# Patient Record
Sex: Male | Born: 1965
Health system: Southern US, Community
[De-identification: ages and names within clinical notes are randomized; demographics above are authoritative.]

## PROBLEM LIST (undated history)

## (undated) DIAGNOSIS — I10 Essential (primary) hypertension: Secondary | ICD-10-CM

## (undated) HISTORY — DX: Essential (primary) hypertension: I10

## (undated) HISTORY — PX: WISDOM TOOTH EXTRACTION: SHX21

---

## 2000-02-04 ENCOUNTER — Emergency Department (HOSPITAL_COMMUNITY): Admission: EM | Admit: 2000-02-04 | Discharge: 2000-02-04 | Payer: Self-pay | Admitting: Emergency Medicine

## 2000-07-31 ENCOUNTER — Encounter: Payer: Self-pay | Admitting: Emergency Medicine

## 2000-07-31 ENCOUNTER — Emergency Department (HOSPITAL_COMMUNITY): Admission: EM | Admit: 2000-07-31 | Discharge: 2000-07-31 | Payer: Self-pay | Admitting: Emergency Medicine

## 2003-05-29 ENCOUNTER — Emergency Department (HOSPITAL_COMMUNITY): Admission: AD | Admit: 2003-05-29 | Discharge: 2003-05-29 | Payer: Self-pay | Admitting: Family Medicine

## 2005-06-18 IMAGING — CR DG KNEE COMPLETE 4+V*L*
4 series · 4 of 4 positions shown · non-contrast
Comparison: none

CLINICAL DATA: Fell, twisting knee.  Pain entire knee.
 FOUR VIEW LEFT KNEE
 An effusion is present.   No fracture is seen.  There is no dislocation.
 IMPRESSION 
 Positive for effusion.

[view not recorded (1 of 4)]
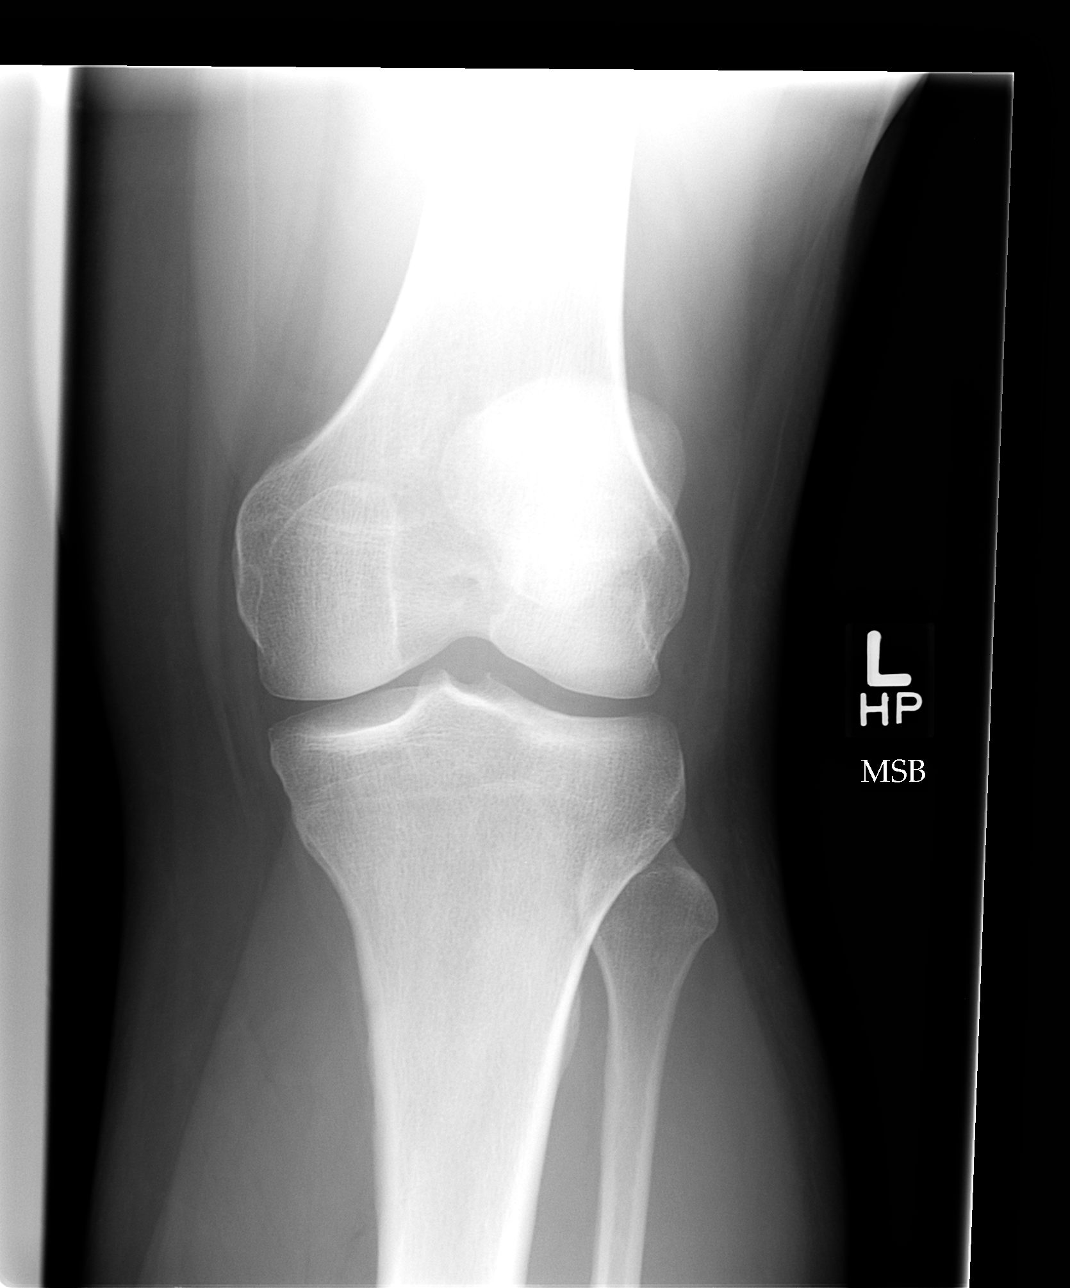

[view not recorded (2 of 4)]
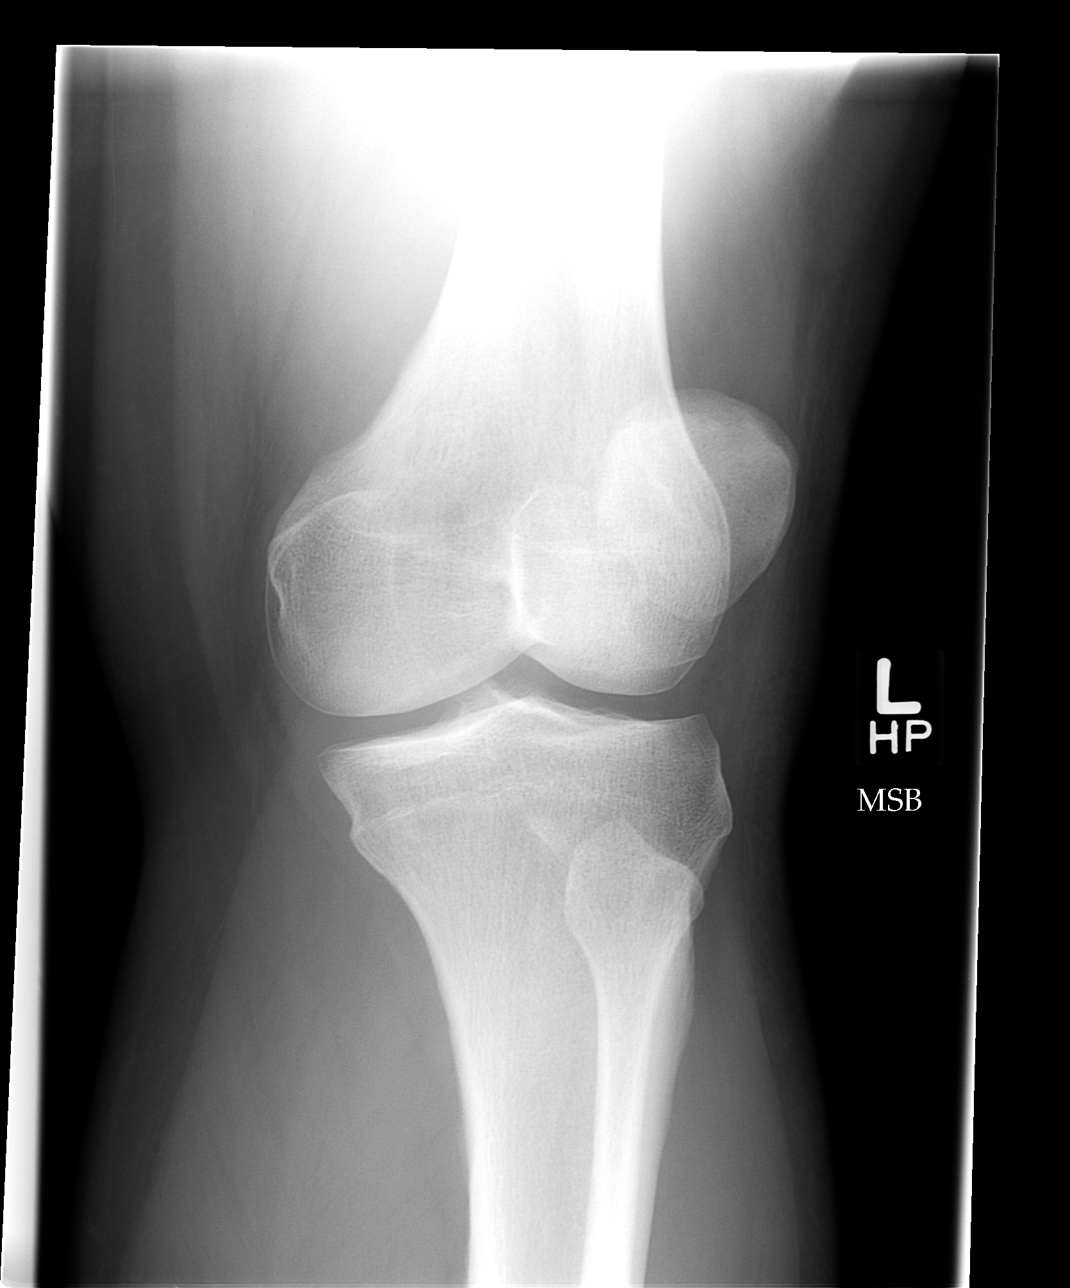

[view not recorded (3 of 4)]
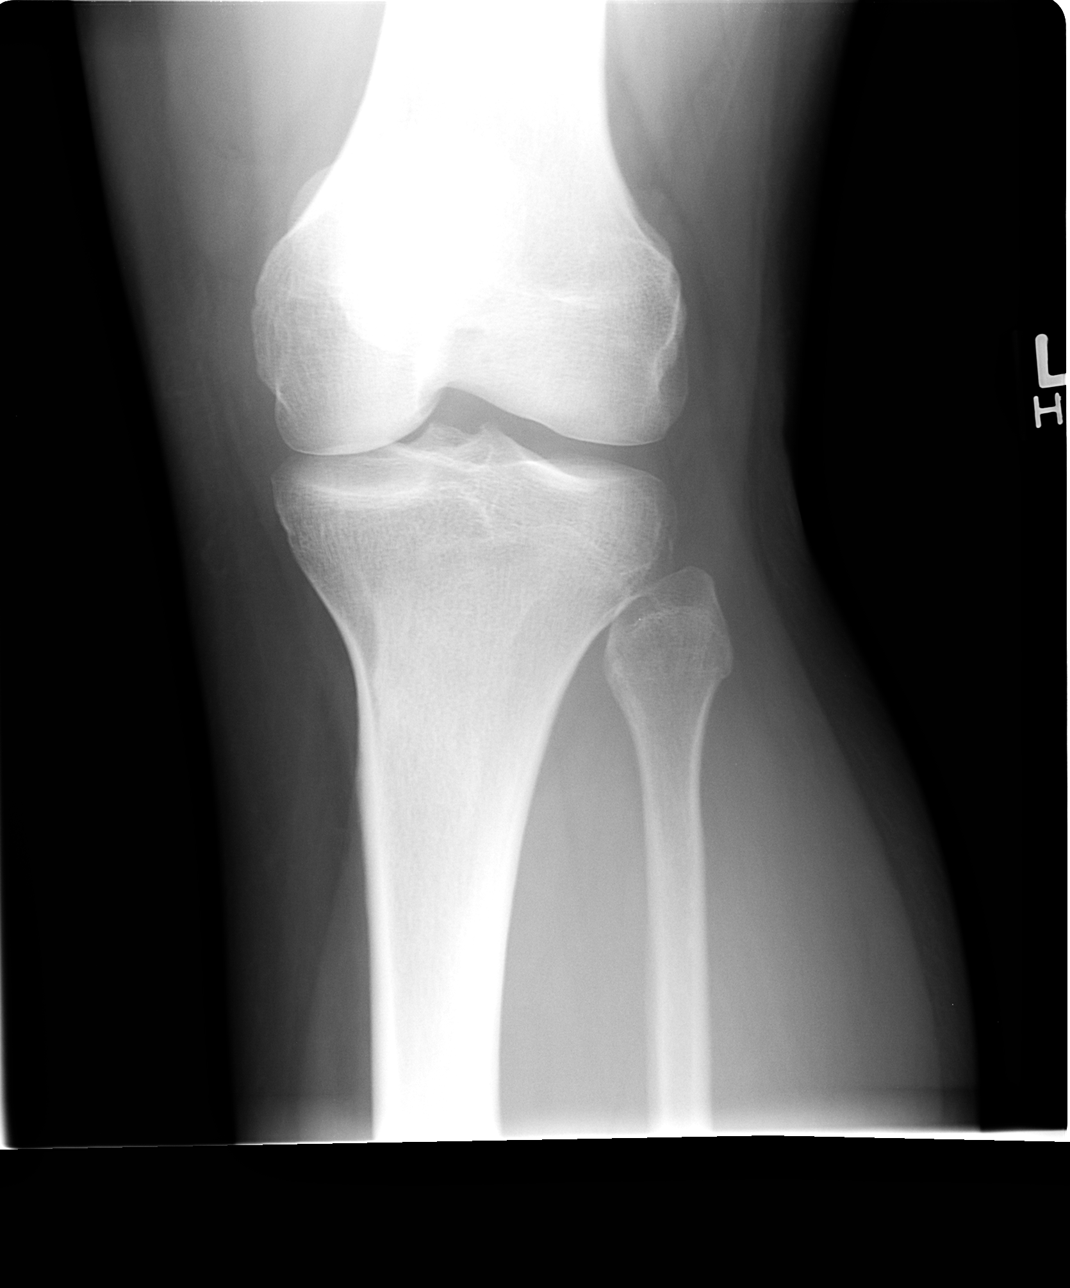

[view not recorded (4 of 4)]
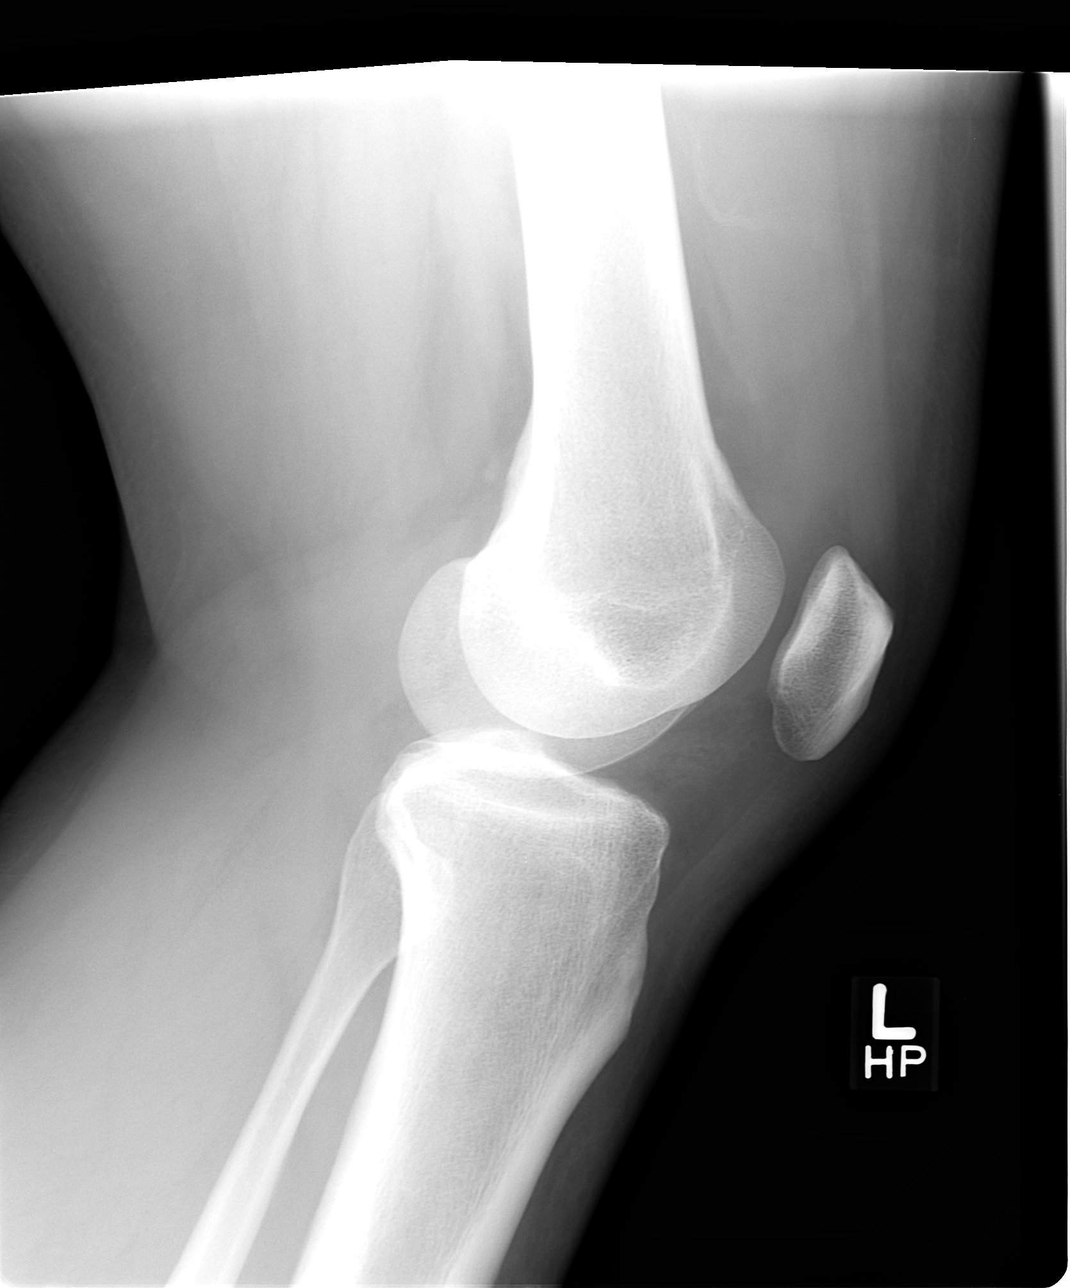

[4 of 4 positions shown; findings below may reference images not displayed]

## 2010-11-08 ENCOUNTER — Encounter: Payer: Self-pay | Admitting: Family Medicine

## 2010-11-08 DIAGNOSIS — M109 Gout, unspecified: Secondary | ICD-10-CM | POA: Insufficient documentation

## 2012-07-18 ENCOUNTER — Other Ambulatory Visit (INDEPENDENT_AMBULATORY_CARE_PROVIDER_SITE_OTHER): Payer: BC Managed Care – PPO

## 2012-07-18 DIAGNOSIS — M109 Gout, unspecified: Secondary | ICD-10-CM

## 2012-07-18 LAB — URIC ACID: Uric Acid, Serum: 8.6 mg/dL — ABNORMAL HIGH (ref 4.0–7.8)

## 2013-08-18 ENCOUNTER — Other Ambulatory Visit: Payer: BC Managed Care – PPO

## 2013-08-18 ENCOUNTER — Other Ambulatory Visit: Payer: Self-pay | Admitting: Family Medicine

## 2013-08-18 DIAGNOSIS — Z0000 Encounter for general adult medical examination without abnormal findings: Secondary | ICD-10-CM

## 2013-08-18 LAB — COMPREHENSIVE METABOLIC PANEL
ALT: 19 U/L (ref 0–53)
AST: 16 U/L (ref 0–37)
Albumin: 4.5 g/dL (ref 3.5–5.2)
Alkaline Phosphatase: 41 U/L (ref 39–117)
BUN: 16 mg/dL (ref 6–23)
CO2: 25 mEq/L (ref 19–32)
Calcium: 9.6 mg/dL (ref 8.4–10.5)
Chloride: 102 mEq/L (ref 96–112)
Creat: 0.91 mg/dL (ref 0.50–1.35)
Glucose, Bld: 100 mg/dL — ABNORMAL HIGH (ref 70–99)
Potassium: 4.5 mEq/L (ref 3.5–5.3)
Sodium: 137 mEq/L (ref 135–145)
Total Bilirubin: 0.9 mg/dL (ref 0.2–1.2)
Total Protein: 7.2 g/dL (ref 6.0–8.3)

## 2013-08-18 LAB — CBC WITH DIFFERENTIAL/PLATELET
Basophils Absolute: 0 10*3/uL (ref 0.0–0.1)
Basophils Relative: 0 % (ref 0–1)
Eosinophils Absolute: 0.1 10*3/uL (ref 0.0–0.7)
Eosinophils Relative: 1 % (ref 0–5)
HCT: 45.5 % (ref 39.0–52.0)
Hemoglobin: 15.8 g/dL (ref 13.0–17.0)
Lymphocytes Relative: 43 % (ref 12–46)
Lymphs Abs: 2.3 10*3/uL (ref 0.7–4.0)
MCH: 30.7 pg (ref 26.0–34.0)
MCHC: 34.7 g/dL (ref 30.0–36.0)
MCV: 88.5 fL (ref 78.0–100.0)
Monocytes Absolute: 0.4 10*3/uL (ref 0.1–1.0)
Monocytes Relative: 8 % (ref 3–12)
Neutro Abs: 2.6 10*3/uL (ref 1.7–7.7)
Neutrophils Relative %: 48 % (ref 43–77)
Platelets: 169 10*3/uL (ref 150–400)
RBC: 5.14 MIL/uL (ref 4.22–5.81)
RDW: 13.2 % (ref 11.5–15.5)
WBC: 5.4 10*3/uL (ref 4.0–10.5)

## 2013-08-18 LAB — LIPID PANEL
Cholesterol: 145 mg/dL (ref 0–200)
HDL: 57 mg/dL (ref >39)
LDL Cholesterol: 72 mg/dL (ref 0–99)
Total CHOL/HDL Ratio: 2.5 Ratio
Triglycerides: 80 mg/dL (ref <150)
VLDL: 16 mg/dL (ref 0–40)

## 2013-08-24 ENCOUNTER — Encounter: Payer: Self-pay | Admitting: Family Medicine

## 2013-08-24 ENCOUNTER — Ambulatory Visit (INDEPENDENT_AMBULATORY_CARE_PROVIDER_SITE_OTHER): Payer: BC Managed Care – PPO | Admitting: Family Medicine

## 2013-08-24 VITALS — BP 122/86 | HR 80 | Temp 98.8°F | Resp 16 | Ht 67.5 in | Wt 228.0 lb

## 2013-08-24 DIAGNOSIS — M109 Gout, unspecified: Secondary | ICD-10-CM

## 2013-08-24 DIAGNOSIS — Z0000 Encounter for general adult medical examination without abnormal findings: Secondary | ICD-10-CM

## 2013-08-24 MED ORDER — ALLOPURINOL 100 MG PO TABS: 300.0000 mg | ORAL_TABLET | Freq: Every day | ORAL | Status: DC

## 2013-08-24 NOTE — Addendum Note (Signed)
Addended by: Reginia FortsATKINS, Jerek Meulemans on: 08/24/2013 09:27 AM   Modules accepted: Orders

## 2013-08-24 NOTE — Progress Notes (Signed)
Subjective:    Patient ID: Paul Lynn, male    DOB: 1966/02/04, 48 y.o.   MRN: [id number]  HPI Patient is here today for complete physical exam. He has no concerns. He still has a possibly 3 or 4 gout flares per year. He took allopurinol 100 mg by mouth daily. His check acid level remained elevated at 8.6. The patient stopped the medication thereafter and was lost to followup. Otherwise he is doing well. He does have a significant family history of prostate cancer in the 71s. Therefore he is due for premature screen for prostate cancer. Lab on 08/18/2013  Component Date Value Ref Range Status  . Cholesterol 08/18/2013 145  0 - 200 mg/dL Final   Comment: ATP III Classification:                                < 200        mg/dL        Desirable                               200 - 239     mg/dL        Borderline High                               >= 240        mg/dL        High                             . Triglycerides 08/18/2013 80  <150 mg/dL Final  . HDL 08/18/2013 57  >39 mg/dL Final  . Total CHOL/HDL Ratio 08/18/2013 2.5   Final  . VLDL 08/18/2013 16  0 - 40 mg/dL Final  . LDL Cholesterol 08/18/2013 72  0 - 99 mg/dL Final   Comment:                            Total Cholesterol/HDL Ratio:CHD Risk                                                 Coronary Heart Disease Risk Table                                                                 Men       Women                                   1/2 Average Risk              3.4        3.3  Average Risk              5.0        4.4                                    2X Average Risk              9.6        7.1                                    3X Average Risk             23.4       11.0                          Use the calculated Patient Ratio above and the CHD Risk table                           to determine the patient's CHD Risk.                          ATP III Classification (LDL):                        < 100        mg/dL         Optimal                               100 - 129     mg/dL         Near or Above Optimal                               130 - 159     mg/dL         Borderline High                               160 - 189     mg/dL         High                                > 190        mg/dL         Very High                             . WBC 08/18/2013 5.4  4.0 - 10.5 K/uL Final  . RBC 08/18/2013 5.14  4.22 - 5.81 MIL/uL Final  . Hemoglobin 08/18/2013 15.8  13.0 - 17.0 g/dL Final  . HCT 08/18/2013 45.5  39.0 - 52.0 % Final  . MCV 08/18/2013 88.5  78.0 - 100.0 fL Final  . MCH 08/18/2013 30.7  26.0 - 34.0 pg Final  . MCHC 08/18/2013 34.7  30.0 - 36.0 g/dL Final  . RDW 08/18/2013 13.2  11.5 - 15.5 % Final  . Platelets 08/18/2013 169  150 -  400 K/uL Final  . Neutrophils Relative % 08/18/2013 48  43 - 77 % Final  . Neutro Abs 08/18/2013 2.6  1.7 - 7.7 K/uL Final  . Lymphocytes Relative 08/18/2013 43  12 - 46 % Final  . Lymphs Abs 08/18/2013 2.3  0.7 - 4.0 K/uL Final  . Monocytes Relative 08/18/2013 8  3 - 12 % Final  . Monocytes Absolute 08/18/2013 0.4  0.1 - 1.0 K/uL Final  . Eosinophils Relative 08/18/2013 1  0 - 5 % Final  . Eosinophils Absolute 08/18/2013 0.1  0.0 - 0.7 K/uL Final  . Basophils Relative 08/18/2013 0  0 - 1 % Final  . Basophils Absolute 08/18/2013 0.0  0.0 - 0.1 K/uL Final  . Smear Review 08/18/2013 Criteria for review not met   Final  . Sodium 08/18/2013 137  135 - 145 mEq/L Final  . Potassium 08/18/2013 4.5  3.5 - 5.3 mEq/L Final  . Chloride 08/18/2013 102  96 - 112 mEq/L Final  . CO2 08/18/2013 25  19 - 32 mEq/L Final  . Glucose, Bld 08/18/2013 100* 70 - 99 mg/dL Final  . BUN 25/48/3234 16  6 - 23 mg/dL Final  . Creat 68/87/3730 0.91  0.50 - 1.35 mg/dL Final  . Total Bilirubin 08/18/2013 0.9  0.2 - 1.2 mg/dL Final  . Alkaline Phosphatase 08/18/2013 41  39 - 117 U/L Final  . AST 08/18/2013 16  0 - 37 U/L Final  . ALT 08/18/2013 19  0  - 53 U/L Final  . Total Protein 08/18/2013 7.2  6.0 - 8.3 g/dL Final  . Albumin 81/68/3870 4.5  3.5 - 5.2 g/dL Final  . Calcium 65/82/6088 9.6  8.4 - 10.5 mg/dL Final   Past Medical History  Diagnosis Date  . Gout    Current Outpatient Prescriptions on File Prior to Visit  Medication Sig Dispense Refill  . indomethacin (INDOCIN) 50 MG capsule Take 50 mg by mouth 2 (two) times daily with a meal.         No current facility-administered medications on file prior to visit.   No Known Allergies History reviewed. No pertinent past surgical history. History   Social History  . Marital Status: Married    Spouse Name: N/A    Number of Children: N/A  . Years of Education: N/A   Occupational History  . Not on file.   Social History Main Topics  . Smoking status: Never Smoker   . Smokeless tobacco: Never Used  . Alcohol Use: Yes     Comment: occasional  . Drug Use: No  . Sexual Activity: Yes   Other Topics Concern  . Not on file   Social History Narrative  . No narrative on file   Family History  Problem Relation Age of Onset  . Cancer Father     prostate  . Cancer Paternal Uncle     prostate      Review of Systems  All other systems reviewed and are negative.      Objective:   Physical Exam  Vitals reviewed. Constitutional: He is oriented to person, place, and time. He appears well-developed and well-nourished. No distress.  HENT:  Head: Normocephalic and atraumatic.  Right Ear: External ear normal.  Left Ear: External ear normal.  Nose: Nose normal.  Mouth/Throat: Oropharynx is clear and moist. No oropharyngeal exudate.  Eyes: Conjunctivae and EOM are normal. Pupils are equal, round, and reactive to light. Right eye exhibits no discharge. Left eye exhibits no  discharge. No scleral icterus.  Neck: Normal range of motion. Neck supple. No JVD present. No tracheal deviation present. No thyromegaly present.  Cardiovascular: Normal rate, regular rhythm, normal  heart sounds and intact distal pulses.  Exam reveals no gallop and no friction rub.   No murmur heard. Pulmonary/Chest: Effort normal and breath sounds normal. No stridor. No respiratory distress. He has no wheezes. He has no rales. He exhibits no tenderness.  Abdominal: Soft. Bowel sounds are normal. He exhibits no distension and no mass. There is no tenderness. There is no rebound and no guarding.  Genitourinary: Rectum normal, prostate normal and penis normal.  Musculoskeletal: Normal range of motion. He exhibits no edema and no tenderness.  Lymphadenopathy:    He has no cervical adenopathy.  Neurological: He is alert and oriented to person, place, and time. He has normal reflexes. He displays normal reflexes. No cranial nerve deficit. He exhibits normal muscle tone. Coordination normal.  Skin: Skin is warm. No rash noted. He is not diaphoretic. No erythema. No pallor.  Psychiatric: He has a normal mood and affect. His behavior is normal. Judgment and thought content normal.          Assessment & Plan:  1. Routine general medical examination at a health care facility Patient's physical exam is completely normal. Regular anticipatory guidance was provided. I recommended 10-15 pounds weight loss. Otherwise his labs are excellent. His blood pressure is excellent his preventative care is up to date. I will add a PSA - PSA  2. Gout Begin allopurinol 300 mg by mouth daily. Start indomethacin 25 mg by mouth daily. In 6 weeks return for a uric acid level. His uric acid level is less than 6, the patient can then discontinue indomethacin. - allopurinol (ZYLOPRIM) 100 MG tablet; Take 3 tablets (300 mg total) by mouth daily.  Dispense: 90 tablet; Refill: 5

## 2013-08-25 ENCOUNTER — Encounter: Payer: Self-pay | Admitting: Family Medicine

## 2013-08-25 LAB — PSA: PSA: 1.25 ng/mL (ref <=4.00)

## 2013-10-12 ENCOUNTER — Other Ambulatory Visit: Payer: BC Managed Care – PPO

## 2013-10-12 DIAGNOSIS — E790 Hyperuricemia without signs of inflammatory arthritis and tophaceous disease: Secondary | ICD-10-CM

## 2013-10-12 LAB — URIC ACID: URIC ACID, SERUM: 6.5 mg/dL (ref 4.0–7.8)

## 2013-10-26 ENCOUNTER — Encounter: Payer: Self-pay | Admitting: *Deleted

## 2014-05-14 ENCOUNTER — Other Ambulatory Visit: Payer: Self-pay | Admitting: Family Medicine

## 2014-05-14 MED ORDER — INDOMETHACIN 50 MG PO CAPS
50.0000 mg | ORAL_CAPSULE | Freq: Two times a day (BID) | ORAL | Status: DC
Start: 2014-05-14 — End: 2015-11-14

## 2014-05-14 MED ORDER — ALLOPURINOL 100 MG PO TABS: 300.0000 mg | ORAL_TABLET | Freq: Every day | ORAL | Status: DC

## 2014-05-14 NOTE — Telephone Encounter (Signed)
Medication refilled per protocol. 

## 2014-05-14 NOTE — Telephone Encounter (Signed)
Patient calling to see if he can get refill on his gout medication  [phone]

## 2014-05-17 ENCOUNTER — Other Ambulatory Visit: Payer: Self-pay | Admitting: *Deleted

## 2014-05-17 MED ORDER — INDOMETHACIN 50 MG PO CAPS: 50.0000 mg | ORAL_CAPSULE | Freq: Two times a day (BID) | ORAL | Status: DC

## 2014-05-17 MED ORDER — COLCHICINE 0.6 MG PO TABS: ORAL_TABLET | ORAL | Status: DC

## 2014-05-17 NOTE — Telephone Encounter (Signed)
Prescription sent to pharmacy.

## 2014-05-17 NOTE — Telephone Encounter (Signed)
Ok with colcrys for gout.  1.2 mg po x 1 and 0.6 mg po in 2 hrs if persistent.

## 2014-05-17 NOTE — Telephone Encounter (Signed)
Received fax requesting refill on Colcrys 0.6mg .   Medication not noted in chart.   MD please advise.

## 2014-09-01 ENCOUNTER — Other Ambulatory Visit: Payer: BLUE CROSS/BLUE SHIELD

## 2014-09-01 ENCOUNTER — Other Ambulatory Visit: Payer: Self-pay | Admitting: Family Medicine

## 2014-09-01 DIAGNOSIS — Z79899 Other long term (current) drug therapy: Secondary | ICD-10-CM

## 2014-09-01 DIAGNOSIS — Z0000 Encounter for general adult medical examination without abnormal findings: Secondary | ICD-10-CM

## 2014-09-01 DIAGNOSIS — Z8739 Personal history of other diseases of the musculoskeletal system and connective tissue: Secondary | ICD-10-CM

## 2014-09-01 LAB — CBC WITH DIFFERENTIAL/PLATELET
Basophils Absolute: 0 10*3/uL (ref 0.0–0.1)
Basophils Relative: 0 % (ref 0–1)
EOS ABS: 0.1 10*3/uL (ref 0.0–0.7)
EOS PCT: 2 % (ref 0–5)
HEMATOCRIT: 47 % (ref 39.0–52.0)
Hemoglobin: 16 g/dL (ref 13.0–17.0)
LYMPHS ABS: 2.1 10*3/uL (ref 0.7–4.0)
LYMPHS PCT: 40 % (ref 12–46)
MCH: 30.7 pg (ref 26.0–34.0)
MCHC: 34 g/dL (ref 30.0–36.0)
MCV: 90.2 fL (ref 78.0–100.0)
MONO ABS: 0.5 10*3/uL (ref 0.1–1.0)
MONOS PCT: 9 % (ref 3–12)
MPV: 10.4 fL (ref 8.6–12.4)
Neutro Abs: 2.6 10*3/uL (ref 1.7–7.7)
Neutrophils Relative %: 49 % (ref 43–77)
Platelets: 160 10*3/uL (ref 150–400)
RBC: 5.21 MIL/uL (ref 4.22–5.81)
RDW: 13 % (ref 11.5–15.5)
WBC: 5.3 10*3/uL (ref 4.0–10.5)

## 2014-09-01 LAB — COMPLETE METABOLIC PANEL WITH GFR
ALK PHOS: 41 U/L (ref 39–117)
ALT: 19 U/L (ref 0–53)
AST: 19 U/L (ref 0–37)
Albumin: 4.3 g/dL (ref 3.5–5.2)
BUN: 16 mg/dL (ref 6–23)
CO2: 23 mEq/L (ref 19–32)
CREATININE: 0.97 mg/dL (ref 0.50–1.35)
Calcium: 9.1 mg/dL (ref 8.4–10.5)
Chloride: 104 mEq/L (ref 96–112)
GFR, Est African American: >89 mL/min
GLUCOSE: 96 mg/dL (ref 70–99)
Potassium: 4.1 mEq/L (ref 3.5–5.3)
Sodium: 138 mEq/L (ref 135–145)
TOTAL PROTEIN: 6.9 g/dL (ref 6.0–8.3)
Total Bilirubin: 1.2 mg/dL (ref 0.2–1.2)

## 2014-09-01 LAB — LIPID PANEL
Cholesterol: 123 mg/dL (ref 0–200)
HDL: 44 mg/dL (ref >=40)
LDL Cholesterol: 65 mg/dL (ref 0–99)
Total CHOL/HDL Ratio: 2.8 Ratio
Triglycerides: 68 mg/dL (ref <150)
VLDL: 14 mg/dL (ref 0–40)

## 2014-09-02 LAB — TSH: TSH: 1.794 u[IU]/mL (ref 0.350–4.500)

## 2014-09-03 ENCOUNTER — Encounter: Payer: Self-pay | Admitting: Family Medicine

## 2014-09-03 ENCOUNTER — Ambulatory Visit (INDEPENDENT_AMBULATORY_CARE_PROVIDER_SITE_OTHER): Payer: BLUE CROSS/BLUE SHIELD | Admitting: Family Medicine

## 2014-09-03 VITALS — BP 136/94 | HR 80 | Temp 98.5°F | Resp 16 | Ht 69.0 in | Wt 235.0 lb

## 2014-09-03 DIAGNOSIS — Z0000 Encounter for general adult medical examination without abnormal findings: Secondary | ICD-10-CM | POA: Diagnosis not present

## 2014-09-03 NOTE — Progress Notes (Signed)
Subjective:    Patient ID: Paul Lynn, male    DOB: 11-29-1965, 49 y.o.   MRN: [id number]  HPI  Patient is here today for complete physical exam. He has no medical concerns. He is doing very well. Blood pressures elevated at 136/94. Most recent lab work as listed below: Lab on 09/01/2014  Component Date Value Ref Range Status  . Sodium 09/01/2014 138  135 - 145 mEq/L Final  . Potassium 09/01/2014 4.1  3.5 - 5.3 mEq/L Final  . Chloride 09/01/2014 104  96 - 112 mEq/L Final  . CO2 09/01/2014 23  19 - 32 mEq/L Final  . Glucose, Bld 09/01/2014 96  70 - 99 mg/dL Final  . BUN 09/01/2014 16  6 - 23 mg/dL Final  . Creat 09/01/2014 0.97  0.50 - 1.35 mg/dL Final  . Total Bilirubin 09/01/2014 1.2  0.2 - 1.2 mg/dL Final  . Alkaline Phosphatase 09/01/2014 41  39 - 117 U/L Final  . AST 09/01/2014 19  0 - 37 U/L Final  . ALT 09/01/2014 19  0 - 53 U/L Final  . Total Protein 09/01/2014 6.9  6.0 - 8.3 g/dL Final  . Albumin 09/01/2014 4.3  3.5 - 5.2 g/dL Final  . Calcium 09/01/2014 9.1  8.4 - 10.5 mg/dL Final  . GFR, Est African American 09/01/2014 >89   Final  . GFR, Est Non African American 09/01/2014 >89   Final   Comment:   The estimated GFR is a calculation valid for adults (>=84 years old) that uses the CKD-EPI algorithm to adjust for age and sex. It is   not to be used for children, pregnant women, hospitalized patients,    patients on dialysis, or with rapidly changing kidney function. According to the NKDEP, eGFR >89 is normal, 60-89 shows mild impairment, 30-59 shows moderate impairment, 15-29 shows severe impairment and <15 is ESRD.     . TSH 09/01/2014 1.794  0.350 - 4.500 uIU/mL Final  . WBC 09/01/2014 5.3  4.0 - 10.5 K/uL Final  . RBC 09/01/2014 5.21  4.22 - 5.81 MIL/uL Final  . Hemoglobin 09/01/2014 16.0  13.0 - 17.0 g/dL Final  . HCT 09/01/2014 47.0  39.0 - 52.0 % Final  . MCV 09/01/2014 90.2  78.0 - 100.0 fL Final  . MCH 09/01/2014 30.7  26.0 - 34.0 pg Final  . MCHC  09/01/2014 34.0  30.0 - 36.0 g/dL Final  . RDW 09/01/2014 13.0  11.5 - 15.5 % Final  . Platelets 09/01/2014 160  150 - 400 K/uL Final  . MPV 09/01/2014 10.4  8.6 - 12.4 fL Final  . Neutrophils Relative % 09/01/2014 49  43 - 77 % Final  . Neutro Abs 09/01/2014 2.6  1.7 - 7.7 K/uL Final  . Lymphocytes Relative 09/01/2014 40  12 - 46 % Final  . Lymphs Abs 09/01/2014 2.1  0.7 - 4.0 K/uL Final  . Monocytes Relative 09/01/2014 9  3 - 12 % Final  . Monocytes Absolute 09/01/2014 0.5  0.1 - 1.0 K/uL Final  . Eosinophils Relative 09/01/2014 2  0 - 5 % Final  . Eosinophils Absolute 09/01/2014 0.1  0.0 - 0.7 K/uL Final  . Basophils Relative 09/01/2014 0  0 - 1 % Final  . Basophils Absolute 09/01/2014 0.0  0.0 - 0.1 K/uL Final  . Smear Review 09/01/2014 Criteria for review not met   Final  . Cholesterol 09/01/2014 123  0 - 200 mg/dL Final   Comment: ATP III Classification:       <  200        mg/dL        Desirable      200 - 239     mg/dL        Borderline High      >= 240        mg/dL        High     . Triglycerides 09/01/2014 68  <150 mg/dL Final  . HDL 09/01/2014 44  >=40 mg/dL Final   ** Please note change in reference range(s). **  . Total CHOL/HDL Ratio 09/01/2014 2.8   Final  . VLDL 09/01/2014 14  0 - 40 mg/dL Final  . LDL Cholesterol 09/01/2014 65  0 - 99 mg/dL Final   Comment:   Total Cholesterol/HDL Ratio:CHD Risk                        Coronary Heart Disease Risk Table                                        Men       Women          1/2 Average Risk              3.4        3.3              Average Risk              5.0        4.4           2X Average Risk              9.6        7.1           3X Average Risk             23.4       11.0 Use the calculated Patient Ratio above and the CHD Risk table  to determine the patient's CHD Risk. ATP III Classification (LDL):       < 100        mg/dL         Optimal      100 - 129     mg/dL         Near or Above Optimal      130 - 159      mg/dL         Borderline High      160 - 189     mg/dL         High       > 190        mg/dL         Very High      Past Medical History  Diagnosis Date  . Gout    No past surgical history on file. Current Outpatient Prescriptions on File Prior to Visit  Medication Sig Dispense Refill  . allopurinol (ZYLOPRIM) 100 MG tablet Take 3 tablets (300 mg total) by mouth daily. 90 tablet 2  . colchicine 0.6 MG tablet Take (2) tabs PO x1, then (1) tab PO q 2hours PRN- gout. 30 tablet 3  . indomethacin (INDOCIN) 50 MG capsule Take 1 capsule (50 mg total) by mouth 2 (two) times daily with a meal. 60 capsule 2   No current facility-administered  medications on file prior to visit.   No Known Allergies History   Social History  . Marital Status: Married    Spouse Name: N/A  . Number of Children: N/A  . Years of Education: N/A   Occupational History  . Not on file.   Social History Main Topics  . Smoking status: Never Smoker   . Smokeless tobacco: Never Used  . Alcohol Use: Yes     Comment: occasional  . Drug Use: No  . Sexual Activity: Yes   Other Topics Concern  . Not on file   Social History Narrative   Family History  Problem Relation Age of Onset  . Cancer Father     prostate  . Cancer Paternal Uncle     prostate     Review of Systems  All other systems reviewed and are negative.      Objective:   Physical Exam  Constitutional: He is oriented to person, place, and time. He appears well-developed and well-nourished. No distress.  HENT:  Head: Normocephalic and atraumatic.  Right Ear: External ear normal.  Left Ear: External ear normal.  Nose: Nose normal.  Mouth/Throat: Oropharynx is clear and moist. No oropharyngeal exudate.  Eyes: Conjunctivae and EOM are normal. Pupils are equal, round, and reactive to light. Right eye exhibits no discharge. Left eye exhibits no discharge. No scleral icterus.  Neck: Normal range of motion. Neck supple. No JVD present. No  tracheal deviation present. No thyromegaly present.  Cardiovascular: Normal rate, regular rhythm, normal heart sounds and intact distal pulses.  Exam reveals no gallop and no friction rub.   No murmur heard. Pulmonary/Chest: Effort normal and breath sounds normal. No stridor. No respiratory distress. He has no wheezes. He has no rales. He exhibits no tenderness.  Abdominal: Soft. Bowel sounds are normal. He exhibits no distension and no mass. There is no tenderness. There is no rebound and no guarding.  Genitourinary: Rectum normal and prostate normal.  Musculoskeletal: Normal range of motion. He exhibits no edema or tenderness.  Lymphadenopathy:    He has no cervical adenopathy.  Neurological: He is alert and oriented to person, place, and time. He displays normal reflexes. No cranial nerve deficit. He exhibits normal muscle tone. Coordination normal.  Skin: Skin is warm. No rash noted. He is not diaphoretic. No erythema. No pallor.  Psychiatric: He has a normal mood and affect. His behavior is normal. Judgment and thought content normal.  Vitals reviewed.         Assessment & Plan:  Routine general medical examination at a health care facility  Patient's physical exam is normal outside of his weight. I encouraged diet exercise and weight loss. His blood pressure slightly elevated. I have asked the patient check his blood pressure several times over the next month and then report the values to me. If his diastolic blood pressures consistently elevated, I will start the patient on losartan to address. Otherwise cancer screening is up-to-date. I will add a PSA to his lab work given his significant family history for early prostate cancer. Prostate exam today is normal.

## 2014-09-04 LAB — PSA: PSA: 0.95 ng/mL (ref <=4.00)

## 2014-09-07 ENCOUNTER — Encounter: Payer: Self-pay | Admitting: Family Medicine

## 2015-01-10 ENCOUNTER — Other Ambulatory Visit: Payer: Self-pay | Admitting: Family Medicine

## 2015-02-22 ENCOUNTER — Telehealth: Payer: Self-pay | Admitting: Family Medicine

## 2015-02-22 MED ORDER — ALLOPURINOL 100 MG PO TABS: ORAL_TABLET | ORAL | Status: DC

## 2015-02-22 NOTE — Telephone Encounter (Signed)
Patient needs refill on allopurinol    [facility] [address]

## 2015-02-22 NOTE — Telephone Encounter (Signed)
Medication called/sent to requested pharmacy  

## 2015-11-14 ENCOUNTER — Other Ambulatory Visit: Payer: Self-pay | Admitting: Family Medicine

## 2016-09-21 ENCOUNTER — Other Ambulatory Visit: Payer: Self-pay | Admitting: Family Medicine

## 2016-09-21 DIAGNOSIS — M109 Gout, unspecified: Secondary | ICD-10-CM

## 2016-09-21 DIAGNOSIS — Z0000 Encounter for general adult medical examination without abnormal findings: Secondary | ICD-10-CM

## 2016-09-21 DIAGNOSIS — Z125 Encounter for screening for malignant neoplasm of prostate: Secondary | ICD-10-CM

## 2016-09-21 DIAGNOSIS — Z79899 Other long term (current) drug therapy: Secondary | ICD-10-CM

## 2016-09-25 ENCOUNTER — Other Ambulatory Visit: Payer: BLUE CROSS/BLUE SHIELD

## 2016-09-25 DIAGNOSIS — Z0000 Encounter for general adult medical examination without abnormal findings: Secondary | ICD-10-CM

## 2016-09-25 DIAGNOSIS — Z79899 Other long term (current) drug therapy: Secondary | ICD-10-CM | POA: Diagnosis not present

## 2016-09-25 DIAGNOSIS — Z125 Encounter for screening for malignant neoplasm of prostate: Secondary | ICD-10-CM | POA: Diagnosis not present

## 2016-09-25 DIAGNOSIS — M109 Gout, unspecified: Secondary | ICD-10-CM | POA: Diagnosis not present

## 2016-09-25 LAB — CBC WITH DIFFERENTIAL/PLATELET
BASOS PCT: 0 %
Basophils Absolute: 0 cells/uL (ref 0–200)
EOS PCT: 1 %
Eosinophils Absolute: 53 cells/uL (ref 15–500)
HCT: 47.4 % (ref 38.5–50.0)
Hemoglobin: 15.7 g/dL (ref 13.0–17.0)
LYMPHS ABS: 2120 {cells}/uL (ref 850–3900)
LYMPHS PCT: 40 %
MCH: 30.1 pg (ref 27.0–33.0)
MCHC: 33.1 g/dL (ref 32.0–36.0)
MCV: 91 fL (ref 80.0–100.0)
MONO ABS: 477 {cells}/uL (ref 200–950)
MPV: 10.5 fL (ref 7.5–12.5)
Monocytes Relative: 9 %
NEUTROS PCT: 50 %
Neutro Abs: 2650 cells/uL (ref 1500–7800)
Platelets: 162 10*3/uL (ref 140–400)
RBC: 5.21 MIL/uL (ref 4.20–5.80)
RDW: 13.3 % (ref 11.0–15.0)
WBC: 5.3 10*3/uL (ref 3.8–10.8)

## 2016-09-25 LAB — COMPLETE METABOLIC PANEL WITH GFR
ALBUMIN: 4.2 g/dL (ref 3.6–5.1)
ALK PHOS: 46 U/L (ref 40–115)
ALT: 16 U/L (ref 9–46)
AST: 16 U/L (ref 10–35)
BUN: 18 mg/dL (ref 7–25)
CALCIUM: 9.6 mg/dL (ref 8.6–10.3)
CO2: 22 mmol/L (ref 20–31)
CREATININE: 0.98 mg/dL (ref 0.70–1.33)
Chloride: 102 mmol/L (ref 98–110)
GFR, Est Non African American: >89 mL/min (ref >=60)
Glucose, Bld: 92 mg/dL (ref 70–99)
Potassium: 4.3 mmol/L (ref 3.5–5.3)
Sodium: 139 mmol/L (ref 135–146)
Total Bilirubin: 0.9 mg/dL (ref 0.2–1.2)
Total Protein: 6.8 g/dL (ref 6.1–8.1)

## 2016-09-25 LAB — LIPID PANEL
CHOL/HDL RATIO: 2.7 ratio (ref <5.0)
Cholesterol: 146 mg/dL (ref <200)
HDL: 54 mg/dL (ref >40)
LDL CALC: 76 mg/dL (ref <100)
Triglycerides: 78 mg/dL (ref <150)
VLDL: 16 mg/dL (ref <30)

## 2016-09-25 LAB — TSH: TSH: 1.49 mIU/L (ref 0.40–4.50)

## 2016-09-26 LAB — PSA: PSA: 1.2 ng/mL (ref <=4.0)

## 2016-09-28 ENCOUNTER — Encounter: Payer: Self-pay | Admitting: Family Medicine

## 2016-09-28 ENCOUNTER — Ambulatory Visit (INDEPENDENT_AMBULATORY_CARE_PROVIDER_SITE_OTHER): Payer: BLUE CROSS/BLUE SHIELD | Admitting: Family Medicine

## 2016-09-28 VITALS — BP 142/94 | HR 82 | Temp 98.6°F | Resp 16 | Ht 67.5 in | Wt 226.0 lb

## 2016-09-28 DIAGNOSIS — Z0000 Encounter for general adult medical examination without abnormal findings: Secondary | ICD-10-CM | POA: Diagnosis not present

## 2016-09-28 DIAGNOSIS — Z1211 Encounter for screening for malignant neoplasm of colon: Secondary | ICD-10-CM | POA: Diagnosis not present

## 2016-09-28 DIAGNOSIS — I10 Essential (primary) hypertension: Secondary | ICD-10-CM | POA: Diagnosis not present

## 2016-09-28 MED ORDER — LISINOPRIL 20 MG PO TABS: 20.0000 mg | ORAL_TABLET | Freq: Every day | ORAL | 3 refills | Status: DC

## 2016-09-28 MED ORDER — INDOMETHACIN 50 MG PO CAPS: 50.0000 mg | ORAL_CAPSULE | Freq: Two times a day (BID) | ORAL | 2 refills | Status: AC

## 2016-09-28 MED ORDER — ALLOPURINOL 100 MG PO TABS: ORAL_TABLET | ORAL | 11 refills | Status: DC

## 2016-09-28 NOTE — Progress Notes (Signed)
Subjective:    Patient ID: Paul Lynn, male    DOB: Jul 25, 1965, 51 y.o.   MRN: [id number]  HPI  Patient is here today for complete physical exam. He has no medical concerns. He is doing very well. Blood pressures elevated.  This is the second time his blood pressure has been high. He states that it was also high recently at the dentist office. Most recent lab work as listed below: Appointment on 09/25/2016  Component Date Value Ref Range Status  . TSH 09/25/2016 1.49  0.40 - 4.50 mIU/L Final  . Cholesterol 09/25/2016 146  <200 mg/dL Final  . Triglycerides 09/25/2016 78  <150 mg/dL Final  . HDL 09/25/2016 54  >40 mg/dL Final  . Total CHOL/HDL Ratio 09/25/2016 2.7  <5.0 Ratio Final  . VLDL 09/25/2016 16  <30 mg/dL Final  . LDL Cholesterol 09/25/2016 76  <100 mg/dL Final  . WBC 09/25/2016 5.3  3.8 - 10.8 K/uL Final  . RBC 09/25/2016 5.21  4.20 - 5.80 MIL/uL Final  . Hemoglobin 09/25/2016 15.7  13.0 - 17.0 g/dL Final  . HCT 09/25/2016 47.4  38.5 - 50.0 % Final  . MCV 09/25/2016 91.0  80.0 - 100.0 fL Final  . MCH 09/25/2016 30.1  27.0 - 33.0 pg Final  . MCHC 09/25/2016 33.1  32.0 - 36.0 g/dL Final  . RDW 09/25/2016 13.3  11.0 - 15.0 % Final  . Platelets 09/25/2016 162  140 - 400 K/uL Final  . MPV 09/25/2016 10.5  7.5 - 12.5 fL Final  . Neutro Abs 09/25/2016 2650  1,500 - 7,800 cells/uL Final  . Lymphs Abs 09/25/2016 2120  850 - 3,900 cells/uL Final  . Monocytes Absolute 09/25/2016 477  200 - 950 cells/uL Final  . Eosinophils Absolute 09/25/2016 53  15 - 500 cells/uL Final  . Basophils Absolute 09/25/2016 0  0 - 200 cells/uL Final  . Neutrophils Relative % 09/25/2016 50  % Final  . Lymphocytes Relative 09/25/2016 40  % Final  . Monocytes Relative 09/25/2016 9  % Final  . Eosinophils Relative 09/25/2016 1  % Final  . Basophils Relative 09/25/2016 0  % Final  . Smear Review 09/25/2016 Criteria for review not met   Final  . PSA 09/25/2016 1.2  <=4.0 ng/mL Final   Comment:   The  total PSA value from this assay system is standardized against the WHO standard. The test result will be approximately 20% lower when compared to the equimolar-standardized total PSA (Beck[employer] Coulter). Comparison of serial PSA results should be interpreted with this fact in mind.   This test was performed using the Siemens chemiluminescent method. Values obtained from different assay methods cannot be used interchangeably. PSA levels, regardless of value, should not be interpreted as absolute evidence of the presence or absence of disease.     . Sodium 09/25/2016 139  135 - 146 mmol/L Final  . Potassium 09/25/2016 4.3  3.5 - 5.3 mmol/L Final  . Chloride 09/25/2016 102  98 - 110 mmol/L Final  . CO2 09/25/2016 22  20 - 31 mmol/L Final  . Glucose, Bld 09/25/2016 92  70 - 99 mg/dL Final  . BUN 09/25/2016 18  7 - 25 mg/dL Final  . Creat 09/25/2016 0.98  0.70 - 1.33 mg/dL Final   Comment:   For patients > or = 51 years of age: The upper reference limit for Creatinine is approximately 13% higher for people identified as African-American.     . Total Bilirubin  09/25/2016 0.9  0.2 - 1.2 mg/dL Final  . Alkaline Phosphatase 09/25/2016 46  40 - 115 U/L Final  . AST 09/25/2016 16  10 - 35 U/L Final  . ALT 09/25/2016 16  9 - 46 U/L Final  . Total Protein 09/25/2016 6.8  6.1 - 8.1 g/dL Final  . Albumin 09/25/2016 4.2  3.6 - 5.1 g/dL Final  . Calcium 09/25/2016 9.6  8.6 - 10.3 mg/dL Final  . GFR, Est African American 09/25/2016 >89  >=60 mL/min Final  . GFR, Est Non African American 09/25/2016 >89  >=60 mL/min Final   Past Medical History:  Diagnosis Date  . Gout    No past surgical history on file. Current Outpatient Prescriptions on File Prior to Visit  Medication Sig Dispense Refill  . colchicine 0.6 MG tablet Take (2) tabs PO x1, then (1) tab PO q 2hours PRN- gout. (Patient not taking: Reported on 09/28/2016) 30 tablet 3   No current facility-administered medications on file prior to  visit.    No Known Allergies Social History   Social History  . Marital status: Married    Spouse name: N/A  . Number of children: N/A  . Years of education: N/A   Occupational History  . Not on file.   Social History Main Topics  . Smoking status: Never Smoker  . Smokeless tobacco: Never Used  . Alcohol use Yes     Comment: occasional  . Drug use: No  . Sexual activity: Yes   Other Topics Concern  . Not on file   Social History Narrative  . No narrative on file   Family History  Problem Relation Age of Onset  . Cancer Father        prostate  . Cancer Paternal Uncle        prostate     Review of Systems  All other systems reviewed and are negative.      Objective:   Physical Exam  Constitutional: He is oriented to person, place, and time. He appears well-developed and well-nourished. No distress.  HENT:  Head: Normocephalic and atraumatic.  Right Ear: External ear normal.  Left Ear: External ear normal.  Nose: Nose normal.  Mouth/Throat: Oropharynx is clear and moist. No oropharyngeal exudate.  Eyes: Conjunctivae and EOM are normal. Pupils are equal, round, and reactive to light. Right eye exhibits no discharge. Left eye exhibits no discharge. No scleral icterus.  Neck: Normal range of motion. Neck supple. No JVD present. No tracheal deviation present. No thyromegaly present.  Cardiovascular: Normal rate, regular rhythm, normal heart sounds and intact distal pulses.  Exam reveals no gallop and no friction rub.   No murmur heard. Pulmonary/Chest: Effort normal and breath sounds normal. No stridor. No respiratory distress. He has no wheezes. He has no rales. He exhibits no tenderness.  Abdominal: Soft. Bowel sounds are normal. He exhibits no distension and no mass. There is no tenderness. There is no rebound and no guarding.  Genitourinary: Rectum normal and prostate normal.  Musculoskeletal: Normal range of motion. He exhibits no edema or tenderness.    Lymphadenopathy:    He has no cervical adenopathy.  Neurological: He is alert and oriented to person, place, and time. He displays normal reflexes. No cranial nerve deficit. He exhibits normal muscle tone. Coordination normal.  Skin: Skin is warm. No rash noted. He is not diaphoretic. No erythema. No pallor.  Psychiatric: He has a normal mood and affect. His behavior is normal. Judgment and thought  content normal.  Vitals reviewed.         Assessment & Plan:  General medical exam  Benign essential HTN  Colon cancer screening - Plan: Ambulatory referral to Gastroenterology  Patient's physical exam is normal outside of his weight. I encouraged diet exercise and weight loss. His blood pressure slightly elevated. Begin lisinopril 20 mg by mouth daily and recheck blood pressure in one month. Labs are excellent. Prostate exam is normal. Schedule colonoscopy. Patient declines HIV screening. Tetanus shots up-to-date. We did discuss the shingles vaccine today but this was deferred at the present time.

## 2016-10-02 ENCOUNTER — Encounter: Payer: Self-pay | Admitting: Gastroenterology

## 2016-11-15 ENCOUNTER — Ambulatory Visit (AMBULATORY_SURGERY_CENTER): Payer: Self-pay | Admitting: *Deleted

## 2016-11-15 VITALS — Ht 69.0 in | Wt 221.0 lb

## 2016-11-15 DIAGNOSIS — Z1211 Encounter for screening for malignant neoplasm of colon: Secondary | ICD-10-CM

## 2016-11-15 MED ORDER — NA SULFATE-K SULFATE-MG SULF 17.5-3.13-1.6 GM/177ML PO SOLN: 1.0000 | Freq: Once | ORAL | 0 refills | Status: AC

## 2016-11-15 NOTE — Progress Notes (Signed)
No egg or soy allergy known to patient  No issues with past sedation with any surgeries  or procedures, no intubation problems  No diet pills per patient No home 02 use per patient  No blood thinners per patient  Pt denies issues with constipation  No A fib or A flutter  EMMI video sent to pt's e mail  $15 coupon to pt for suprep in [place] today   

## 2016-11-26 ENCOUNTER — Telehealth: Payer: Self-pay | Admitting: Gastroenterology

## 2016-11-26 NOTE — Telephone Encounter (Signed)
Called.  No answer and no ID on machine when reached. Angela/PV

## 2016-11-27 ENCOUNTER — Encounter: Payer: Self-pay | Admitting: Gastroenterology

## 2016-11-27 NOTE — Telephone Encounter (Signed)
Spoke to [place], pt's wife--Called [facility]-  not sure why needs PA, but prep 116.79 per pharmacist- gave [facility] $ 50 coupon info- prep price now $50 .00  - called back to Lori's number- LM that prep price now $50 and she could pick this up in about an hour per [facility]- Left number to return call if this is an issue.  BIN- [id number] PCN [id number] GROUP- [id number] ID [id number]  Hilda LiasMarie PV

## 2016-11-27 NOTE — Telephone Encounter (Signed)
LM on wife's voice mail to return call. Pt has [employer]- Not sure why this needs a PA.    Hilda LiasMarie PV

## 2016-11-30 ENCOUNTER — Encounter: Payer: Self-pay | Admitting: Gastroenterology

## 2016-12-10 ENCOUNTER — Ambulatory Visit (AMBULATORY_SURGERY_CENTER): Payer: BLUE CROSS/BLUE SHIELD | Admitting: Gastroenterology

## 2016-12-10 ENCOUNTER — Encounter: Payer: Self-pay | Admitting: Gastroenterology

## 2016-12-10 VITALS — BP 137/78 | HR 60 | Temp 96.9°F | Resp 16 | Ht 69.0 in | Wt 221.0 lb

## 2016-12-10 DIAGNOSIS — Z1211 Encounter for screening for malignant neoplasm of colon: Secondary | ICD-10-CM

## 2016-12-10 DIAGNOSIS — Z1212 Encounter for screening for malignant neoplasm of rectum: Secondary | ICD-10-CM | POA: Diagnosis not present

## 2016-12-10 DIAGNOSIS — K5730 Diverticulosis of large intestine without perforation or abscess without bleeding: Secondary | ICD-10-CM | POA: Diagnosis not present

## 2016-12-10 MED ORDER — SODIUM CHLORIDE 0.9 % IV SOLN: 500.0000 mL | INTRAVENOUS | Status: DC

## 2016-12-10 NOTE — Patient Instructions (Signed)
Information on diverticulosis given to you today.  YOU HAD AN ENDOSCOPIC PROCEDURE TODAY AT THE [facility]:   Refer to the procedure report that was given to you for any specific questions about what was found during the examination.  If the procedure report does not answer your questions, please call your gastroenterologist to clarify.  If you requested that your care partner not be given the details of your procedure findings, then the procedure report has been included in a sealed envelope for you to review at your convenience later.  YOU SHOULD EXPECT: Some feelings of bloating in the abdomen. Passage of more gas than usual.  Walking can help get rid of the air that was put into your GI tract during the procedure and reduce the bloating. If you had a lower endoscopy (such as a colonoscopy or flexible sigmoidoscopy) you may notice spotting of blood in your stool or on the toilet paper. If you underwent a bowel prep for your procedure, you may not have a normal bowel movement for a few days.  Please Note:  You might notice some irritation and congestion in your nose or some drainage.  This is from the oxygen used during your procedure.  There is no need for concern and it should clear up in a day or so.  SYMPTOMS TO REPORT IMMEDIATELY:   Following lower endoscopy (colonoscopy or flexible sigmoidoscopy):  Excessive amounts of blood in the stool  Significant tenderness or worsening of abdominal pains  Swelling of the abdomen that is new, acute  Fever of 100F or higher   For urgent or emergent issues, a gastroenterologist can be reached at any hour by calling [phone].   DIET:  We do recommend a small meal at first, but then you may proceed to your regular diet.  Drink plenty of fluids but you should avoid alcoholic beverages for 24 hours.  ACTIVITY:  You should plan to take it easy for the rest of today and you should NOT DRIVE or use heavy machinery until tomorrow  (because of the sedation medicines used during the test).    FOLLOW UP: Our staff will call the number listed on your records the next business day following your procedure to check on you and address any questions or concerns that you may have regarding the information given to you following your procedure. If we do not reach you, we will leave a message.  However, if you are feeling well and you are not experiencing any problems, there is no need to return our call.  We will assume that you have returned to your regular daily activities without incident.  If any biopsies were taken you will be contacted by phone or by letter within the next 1-3 weeks.  Please call us at [phone] if you have not heard about the biopsies in 3 weeks.    SIGNATURES/CONFIDENTIALITY: You and/or your care partner have signed paperwork which will be entered into your electronic medical record.  These signatures attest to the fact that that the information above on your After Visit Summary has been reviewed and is understood.  Full responsibility of the confidentiality of this discharge information lies with you and/or your care-partner. 

## 2016-12-10 NOTE — Progress Notes (Signed)
Report to PACU, RN, vss, BBS= Clear.  

## 2016-12-10 NOTE — Progress Notes (Signed)
Pt's states no medical or surgical changes since previsit or office visit. 

## 2016-12-10 NOTE — Op Note (Signed)
[facility] Patient Name: Paul Lynn Procedure Date: 12/10/2016 9:49 AM MRN: [id number] Endoscopist: Rachael Fee , MD Age: 51 Referring MD:  Date of Birth: 1966-01-08 Gender: Male Account #: [id number] Procedure:                Colonoscopy Indications:              Screening for colorectal malignant neoplasm Medicines:                Monitored Anesthesia Care Procedure:                Pre-Anesthesia Assessment:                           - Prior to the procedure, a History and Physical                            was performed, and patient medications and                            allergies were reviewed. The patient's tolerance of                            previous anesthesia was also reviewed. The risks                            and benefits of the procedure and the sedation                            options and risks were discussed with the patient.                            All questions were answered, and informed consent                            was obtained. Prior Anticoagulants: The patient has                            taken no previous anticoagulant or antiplatelet                            agents. ASA Grade Assessment: II - A patient with                            mild systemic disease. After reviewing the risks                            and benefits, the patient was deemed in                            satisfactory condition to undergo the procedure.                           After obtaining informed consent, the colonoscope  was passed under direct vision. Throughout the                            procedure, the patient's blood pressure, pulse, and                            oxygen saturations were monitored continuously. The                            Colonoscope was introduced through the anus and                            advanced to the the cecum, identified by                            appendiceal orifice and  ileocecal valve. The                            colonoscopy was performed without difficulty. The                            patient tolerated the procedure well. The quality                            of the bowel preparation was good. The ileocecal                            valve, appendiceal orifice, and rectum were                            photographed. Scope In: 9:55:11 AM Scope Out: 10:07:33 AM Scope Withdrawal Time: 0 hours 9 minutes 49 seconds  Total Procedure Duration: 0 hours 12 minutes 22 seconds  Findings:                 Multiple small and large-mouthed diverticula were                            found in the left colon. There was mucosal edema,                            petechia associated with the diverticulum.                           The exam was otherwise without abnormality on                            direct and retroflexion views. Complications:            No immediate complications. Estimated blood loss:                            None. Estimated Blood Loss:     Estimated blood loss: none. Impression:               - Moderate diverticulosis in the left  colon.                           - The examination was otherwise normal on direct                            and retroflexion views.                           - No polyps or cancers. Recommendation:           - Patient has a contact number available for                            emergencies. The signs and symptoms of potential                            delayed complications were discussed with the                            patient. Return to normal activities tomorrow.                            Written discharge instructions were provided to the                            patient.                           - Resume previous diet.                           - Continue present medications.                           - Repeat colonoscopy in 10 years for screening                            purposes. Rachael Fee, MD 12/10/2016 10:10:23 AM This report has been signed electronically.

## 2016-12-11 ENCOUNTER — Telehealth: Payer: Self-pay | Admitting: *Deleted

## 2016-12-11 ENCOUNTER — Telehealth: Payer: Self-pay

## 2016-12-11 NOTE — Telephone Encounter (Signed)
  Follow up Call-  Call back number 12/10/2016  Post procedure Call Back phone  # [phone]  Permission to leave phone message Yes  Some recent data might be hidden     Left message

## 2016-12-11 NOTE — Telephone Encounter (Signed)
Called patient again this afternoon for post procedure call. No answer, unable to leave a message.

## 2017-07-10 DIAGNOSIS — H5319 Other subjective visual disturbances: Secondary | ICD-10-CM | POA: Diagnosis not present

## 2017-07-10 DIAGNOSIS — H43812 Vitreous degeneration, left eye: Secondary | ICD-10-CM | POA: Diagnosis not present

## 2017-07-10 DIAGNOSIS — H1859 Other hereditary corneal dystrophies: Secondary | ICD-10-CM | POA: Diagnosis not present

## 2017-07-10 DIAGNOSIS — H35033 Hypertensive retinopathy, bilateral: Secondary | ICD-10-CM | POA: Diagnosis not present

## 2017-08-13 ENCOUNTER — Other Ambulatory Visit: Payer: Self-pay | Admitting: Family Medicine

## 2017-10-24 ENCOUNTER — Other Ambulatory Visit: Payer: Self-pay | Admitting: Family Medicine

## 2017-10-24 DIAGNOSIS — Z79899 Other long term (current) drug therapy: Secondary | ICD-10-CM

## 2017-10-24 DIAGNOSIS — Z1211 Encounter for screening for malignant neoplasm of colon: Secondary | ICD-10-CM

## 2017-10-24 DIAGNOSIS — Z0000 Encounter for general adult medical examination without abnormal findings: Secondary | ICD-10-CM

## 2017-10-24 DIAGNOSIS — I10 Essential (primary) hypertension: Secondary | ICD-10-CM

## 2017-10-29 ENCOUNTER — Other Ambulatory Visit: Payer: BLUE CROSS/BLUE SHIELD

## 2017-10-29 DIAGNOSIS — Z0000 Encounter for general adult medical examination without abnormal findings: Secondary | ICD-10-CM

## 2017-10-29 DIAGNOSIS — I10 Essential (primary) hypertension: Secondary | ICD-10-CM | POA: Diagnosis not present

## 2017-10-29 DIAGNOSIS — Z79899 Other long term (current) drug therapy: Secondary | ICD-10-CM | POA: Diagnosis not present

## 2017-10-29 DIAGNOSIS — Z1211 Encounter for screening for malignant neoplasm of colon: Secondary | ICD-10-CM

## 2017-10-29 DIAGNOSIS — R7309 Other abnormal glucose: Secondary | ICD-10-CM | POA: Diagnosis not present

## 2017-10-31 LAB — CBC WITH DIFFERENTIAL/PLATELET
BASOS PCT: 0.5 %
Basophils Absolute: 28 cells/uL (ref 0–200)
EOS ABS: 157 {cells}/uL (ref 15–500)
Eosinophils Relative: 2.8 %
HEMATOCRIT: 46.8 % (ref 38.5–50.0)
HEMOGLOBIN: 16.3 g/dL (ref 13.2–17.1)
LYMPHS ABS: 2106 {cells}/uL (ref 850–3900)
MCH: 31.5 pg (ref 27.0–33.0)
MCHC: 34.8 g/dL (ref 32.0–36.0)
MCV: 90.5 fL (ref 80.0–100.0)
MONOS PCT: 10.6 %
MPV: 11.1 fL (ref 7.5–12.5)
NEUTROS ABS: 2716 {cells}/uL (ref 1500–7800)
Neutrophils Relative %: 48.5 %
Platelets: 151 10*3/uL (ref 140–400)
RBC: 5.17 10*6/uL (ref 4.20–5.80)
RDW: 12.1 % (ref 11.0–15.0)
Total Lymphocyte: 37.6 %
WBC: 5.6 10*3/uL (ref 3.8–10.8)
WBCMIX: 594 {cells}/uL (ref 200–950)

## 2017-10-31 LAB — COMPREHENSIVE METABOLIC PANEL
AG Ratio: 2 (calc) (ref 1.0–2.5)
ALBUMIN MSPROF: 4.9 g/dL (ref 3.6–5.1)
ALT: 19 U/L (ref 9–46)
AST: 20 U/L (ref 10–35)
Alkaline phosphatase (APISO): 46 U/L (ref 40–115)
BILIRUBIN TOTAL: 1 mg/dL (ref 0.2–1.2)
BUN: 23 mg/dL (ref 7–25)
CALCIUM: 9.8 mg/dL (ref 8.6–10.3)
CHLORIDE: 103 mmol/L (ref 98–110)
CO2: 25 mmol/L (ref 20–32)
Creat: 0.89 mg/dL (ref 0.70–1.33)
GLOBULIN: 2.4 g/dL (ref 1.9–3.7)
Glucose, Bld: 121 mg/dL — ABNORMAL HIGH (ref 65–99)
POTASSIUM: 4.9 mmol/L (ref 3.5–5.3)
Sodium: 135 mmol/L (ref 135–146)
Total Protein: 7.3 g/dL (ref 6.1–8.1)

## 2017-10-31 LAB — TEST AUTHORIZATION

## 2017-10-31 LAB — LIPID PANEL
CHOLESTEROL: 151 mg/dL (ref <200)
HDL: 57 mg/dL (ref >40)
LDL Cholesterol (Calc): 77 mg/dL (calc)
Non-HDL Cholesterol (Calc): 94 mg/dL (calc) (ref <130)
Total CHOL/HDL Ratio: 2.6 (calc) (ref <5.0)
Triglycerides: 88 mg/dL (ref <150)

## 2017-10-31 LAB — PSA: PSA: 1.1 ng/mL (ref <=4.0)

## 2017-10-31 LAB — HEMOGLOBIN A1C W/OUT EAG: Hgb A1c MFr Bld: 5.4 % of total Hgb (ref <5.7)

## 2017-11-01 ENCOUNTER — Ambulatory Visit (INDEPENDENT_AMBULATORY_CARE_PROVIDER_SITE_OTHER): Payer: BLUE CROSS/BLUE SHIELD | Admitting: Family Medicine

## 2017-11-01 ENCOUNTER — Encounter: Payer: Self-pay | Admitting: Family Medicine

## 2017-11-01 VITALS — BP 130/88 | HR 78 | Temp 98.7°F | Resp 12 | Ht 67.5 in | Wt 228.0 lb

## 2017-11-01 DIAGNOSIS — Z125 Encounter for screening for malignant neoplasm of prostate: Secondary | ICD-10-CM | POA: Diagnosis not present

## 2017-11-01 DIAGNOSIS — Z0000 Encounter for general adult medical examination without abnormal findings: Secondary | ICD-10-CM | POA: Diagnosis not present

## 2017-11-01 DIAGNOSIS — Z23 Encounter for immunization: Secondary | ICD-10-CM

## 2017-11-01 DIAGNOSIS — I10 Essential (primary) hypertension: Secondary | ICD-10-CM | POA: Diagnosis not present

## 2017-11-01 NOTE — Addendum Note (Signed)
Addended by: Legrand RamsWILLIS, SANDY B on: 11/01/2017 09:02 AM   Modules accepted: Orders

## 2017-11-01 NOTE — Progress Notes (Signed)
Subjective:    Patient ID: Paul Lynn, male    DOB: 01/27/1966, 52 y.o.   MRN: [id number]  HPI  Patient is here today for complete physical exam. He has no medical concerns.  Patient had a colonoscopy last year that came back completely normal.  He is not due again until 2028.  According to his chart, he is due for HIV screening however he politely declines this today.  He is also due for a Tdap.  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  Furthermore he has a grandchild less than a year old and he would like a booster for whooping cough.  His lab work is listed below.  Is significant for an elevated fasting blood sugar of 121.  I subsequently checked a hemoglobin A1c that came back completely normal.  Patient admits that he may have had some sugar in his coffee that morning that may have explained  the elevated fasting blood sugar.  He denies any polyuria, polydipsia, or blurry vision Lab on 10/29/2017  Component Date Value Ref Range Status  . PSA 10/29/2017 1.1  < OR = 4.0 ng/mL Final   Comment: The total PSA value from this assay system is  standardized against the WHO standard. The test  result will be approximately 20% lower when compared  to the equimolar-standardized total PSA (Beckman  Coulter). Comparison of serial PSA results should be  interpreted with this fact in mind. . This test was performed using the Siemens  chemiluminescent method. Values obtained from  different assay methods cannot be used interchangeably. PSA levels, regardless of value, should not be interpreted as absolute evidence of the presence or absence of disease.   . Cholesterol 10/29/2017 151  <200 mg/dL Final  . HDL 40/98/1191 57  >40 mg/dL Final  . Triglycerides 10/29/2017 88  <150 mg/dL Final  . LDL Cholesterol (Calc) 10/29/2017 77  mg/dL (calc) Final   Comment: Reference range: <100 . Desirable range <100  mg/dL for primary prevention;   <70 mg/dL for patients with CHD or diabetic patients  with > or = 2 CHD risk factors. Marland Kitchen LDL-C is now calculated using the Martin-Hopkins  calculation, which is a validated novel method providing  better accuracy than the Friedewald equation in the  estimation of LDL-C.  Horald Pollen et al. Lenox Ahr. 4782;956(21): 2061-2068  (http://education.[facility].com/faq/FAQ164)   . Total CHOL/HDL Ratio 10/29/2017 2.6  <3.0 (calc) Final  . Non-HDL Cholesterol (Calc) 10/29/2017 94  <130 mg/dL (calc) Final   Comment: For patients with diabetes plus 1 major ASCVD risk  factor, treating to a non-HDL-C goal of <100 mg/dL  (LDL-C of <86 mg/dL) is considered a therapeutic  option.   . WBC 10/29/2017 5.6  3.8 - 10.8 Thousand/uL Final  . RBC 10/29/2017 5.17  4.20 - 5.80 Million/uL Final  . Hemoglobin 10/29/2017 16.3  13.2 - 17.1 g/dL Final  . HCT 57/84/6962 46.8  38.5 - 50.0 % Final  . MCV 10/29/2017 90.5  80.0 - 100.0 fL Final  . MCH 10/29/2017 31.5  27.0 - 33.0 pg Final  . MCHC 10/29/2017 34.8  32.0 - 36.0 g/dL Final  . RDW 95/28/4132 12.1  11.0 - 15.0 % Final  . Platelets 10/29/2017 151  140 - 400 Thousand/uL Final  . MPV 10/29/2017 11.1  7.5 - 12.5 fL Final  . Neutro Abs 10/29/2017 2,716  1,500 - 7,800 cells/uL Final  .  Lymphs Abs 10/29/2017 2,106  850 - 3,900 cells/uL Final  . WBC mixed population 10/29/2017 594  200 - 950 cells/uL Final  . Eosinophils Absolute 10/29/2017 157  15 - 500 cells/uL Final  . Basophils Absolute 10/29/2017 28  0 - 200 cells/uL Final  . Neutrophils Relative % 10/29/2017 48.5  % Final  . Total Lymphocyte 10/29/2017 37.6  % Final  . Monocytes Relative 10/29/2017 10.6  % Final  . Eosinophils Relative 10/29/2017 2.8  % Final  . Basophils Relative 10/29/2017 0.5  % Final  . Glucose, Bld 10/29/2017 121* 65 - 99 mg/dL Final   Comment: .            Fasting reference interval . For someone without known diabetes, a glucose value between 100  and 125 mg/dL is consistent with prediabetes and should be confirmed with a follow-up test. .   . BUN 10/29/2017 23  7 - 25 mg/dL Final  . Creat 16/01/9603 0.89  0.70 - 1.33 mg/dL Final   Comment: For patients >84 years of age, the reference limit for Creatinine is approximately 13% higher for people identified as African-American. .   Edwena Felty Ratio 10/29/2017 NOT APPLICABLE  6 - 22 (calc) Final  . Sodium 10/29/2017 135  135 - 146 mmol/L Final  . Potassium 10/29/2017 4.9  3.5 - 5.3 mmol/L Final  . Chloride 10/29/2017 103  98 - 110 mmol/L Final  . CO2 10/29/2017 25  20 - 32 mmol/L Final  . Calcium 10/29/2017 9.8  8.6 - 10.3 mg/dL Final  . Total Protein 10/29/2017 7.3  6.1 - 8.1 g/dL Final  . Albumin 54/12/8117 4.9  3.6 - 5.1 g/dL Final  . Globulin 14/78/2956 2.4  1.9 - 3.7 g/dL (calc) Final  . AG Ratio 10/29/2017 2.0  1.0 - 2.5 (calc) Final  . Total Bilirubin 10/29/2017 1.0  0.2 - 1.2 mg/dL Final  . Alkaline phosphatase (APISO) 10/29/2017 46  40 - 115 U/L Final  . AST 10/29/2017 20  10 - 35 U/L Final  . ALT 10/29/2017 19  9 - 46 U/L Final  . Hgb A1c MFr Bld 10/29/2017 5.4  <5.7 % of total Hgb Final   Comment: For the purpose of screening for the presence of diabetes: . <5.7%       Consistent with the absence of diabetes 5.7-6.4%    Consistent with increased risk for diabetes             (prediabetes) > or =6.5%  Consistent with diabetes . This assay result is consistent with a decreased risk of diabetes. . Currently, no consensus exists regarding use of hemoglobin A1c for diagnosis of diabetes in children. . According to American Diabetes Association (ADA) guidelines, hemoglobin A1c <7.0% represents optimal control in non-pregnant diabetic patients. Different metrics may apply to specific patient populations.  Standards of Medical Care in Diabetes(ADA). .   . TEST NAME: 10/29/2017 HEMOGLOBIN A1c   Final  . TEST CODE: 10/29/2017 [id number]   Final  . CLIENT  CONTACT: 10/29/2017 Dwana Curd   Final  . REPORT ALWAYS MESSAGE SIGNATURE 10/29/2017    Final   Comment: . The laboratory testing on this patient was verbally requested or confirmed by the ordering physician or his or her authorized representative after contact with an employee of [employer]. Federal regulations require that we maintain on file written authorization for all laboratory testing.  Accordingly we are asking that the ordering physician or his or her authorized representative sign a  copy of this report and promptly return it to the client service representative. . . Signature:____________________________________________________ . Please fax this signed page to [phone] or return it via your [employer] courier.    Past Medical History:  Diagnosis Date  . Gout   . Hypertension    Past Surgical History:  Procedure Laterality Date  . WISDOM TOOTH EXTRACTION     Current Outpatient Medications on File Prior to Visit  Medication Sig Dispense Refill  . allopurinol (ZYLOPRIM) 100 MG tablet TAKE 3 TABLETS BY MOUTH EVERY DAY 90 tablet 2  . aspirin EC 81 MG tablet Take 81 mg by mouth daily.    . indomethacin (INDOCIN) 50 MG capsule Take 1 capsule (50 mg total) by mouth 2 (two) times daily with a meal. 60 capsule 2  . lisinopril (PRINIVIL,ZESTRIL) 20 MG tablet Take 1 tablet (20 mg total) by mouth daily. 90 tablet 3   Current Facility-Administered Medications on File Prior to Visit  Medication Dose Route Frequency Provider Last Rate Last Dose  . 0.9 %  sodium chloride infusion  500 mL Intravenous Continuous Rachael Fee, MD       No Known Allergies Social History   Socioeconomic History  . Marital status: Married    Spouse name: Not on file  . Number of children: Not on file  . Years of education: Not on file  . Highest education level: Not on file  Occupational History  . Not on file  Social Needs  . Financial resource strain: Not on file  . Food  insecurity:    Worry: Not on file    Inability: Not on file  . Transportation needs:    Medical: Not on file    Non-medical: Not on file  Tobacco Use  . Smoking status: Never Smoker  . Smokeless tobacco: Never Used  Substance and Sexual Activity  . Alcohol use: Yes    Comment: occasional  . Drug use: No  . Sexual activity: Yes  Lifestyle  . Physical activity:    Days per week: Not on file    Minutes per session: Not on file  . Stress: Not on file  Relationships  . Social connections:    Talks on phone: Not on file    Gets together: Not on file    Attends religious service: Not on file    Active member of club or organization: Not on file    Attends meetings of clubs or organizations: Not on file    Relationship status: Not on file  . Intimate partner violence:    Fear of current or ex partner: Not on file    Emotionally abused: Not on file    Physically abused: Not on file    Forced sexual activity: Not on file  Other Topics Concern  . Not on file  Social History Narrative  . Not on file   Family History  Problem Relation Age of Onset  . Cancer Father        prostate  . Prostate cancer Father   . Cancer Paternal Uncle        prostate  . Prostate cancer Paternal Uncle   . Colon cancer Neg Hx   . Colon polyps Neg Hx   . Esophageal cancer Neg Hx   . Rectal cancer Neg Hx   . Stomach cancer Neg Hx      Review of Systems  All other systems reviewed and are negative.      Objective:  Physical Exam  Constitutional: He is oriented to person, place, and time. He appears well-developed and well-nourished. No distress.  HENT:  Head: Normocephalic and atraumatic.  Right Ear: External ear normal.  Left Ear: External ear normal.  Nose: Nose normal.  Mouth/Throat: Oropharynx is clear and moist. No oropharyngeal exudate.  Eyes: Pupils are equal, round, and reactive to light. Conjunctivae and EOM are normal. Right eye exhibits no discharge. Left eye exhibits no  discharge. No scleral icterus.  Neck: Normal range of motion. Neck supple. No JVD present. No tracheal deviation present. No thyromegaly present.  Cardiovascular: Normal rate, regular rhythm, normal heart sounds and intact distal pulses. Exam reveals no gallop and no friction rub.  No murmur heard. Pulmonary/Chest: Effort normal and breath sounds normal. No stridor. No respiratory distress. He has no wheezes. He has no rales. He exhibits no tenderness.  Abdominal: Soft. Bowel sounds are normal. He exhibits no distension and no mass. There is no tenderness. There is no rebound and no guarding.  Musculoskeletal: Normal range of motion. He exhibits no edema or tenderness.  Lymphadenopathy:    He has no cervical adenopathy.  Neurological: He is alert and oriented to person, place, and time. He displays normal reflexes. No cranial nerve deficit. He exhibits normal muscle tone. Coordination normal.  Skin: Skin is warm. No rash noted. He is not diaphoretic. No erythema. No pallor.  Psychiatric: He has a normal mood and affect. His behavior is normal. Judgment and thought content normal.  Vitals reviewed.         Assessment & Plan:  General medical exam  Benign essential HTN  Screening PSA (prostate specific antigen)  Patient's physical exam is normal.  Blood pressure is well controlled at 130/88.  I will make no changes in his medication at this time.  Immunizations are up-to-date aside from the Tdap which he received today.  Patient had a spuriously elevated fasting blood sugar followed up by a normal hemoglobin A1c.  Recommended a low carbohydrate diet and increasing aerobic exercise to help prevent the development of type 2 diabetes and help with weight loss.  Patient politely declines HIV screening.  The remainder of his physical exam and preventative care is up-to-date.  Digital rectal exam today was deferred given his normal PSA and his normal colonoscopy last year

## 2017-12-07 ENCOUNTER — Other Ambulatory Visit: Payer: Self-pay | Admitting: Family Medicine

## 2018-05-01 ENCOUNTER — Other Ambulatory Visit: Payer: Self-pay | Admitting: Family Medicine

## 2018-09-15 ENCOUNTER — Other Ambulatory Visit: Payer: Self-pay | Admitting: Family Medicine

## 2018-10-30 ENCOUNTER — Other Ambulatory Visit: Payer: Self-pay

## 2018-10-30 ENCOUNTER — Other Ambulatory Visit: Payer: BC Managed Care – PPO

## 2018-10-30 DIAGNOSIS — Z0000 Encounter for general adult medical examination without abnormal findings: Secondary | ICD-10-CM | POA: Diagnosis not present

## 2018-10-31 LAB — COMPREHENSIVE METABOLIC PANEL
AG Ratio: 1.8 (calc) (ref 1.0–2.5)
ALT: 20 U/L (ref 9–46)
AST: 20 U/L (ref 10–35)
Albumin: 4.5 g/dL (ref 3.6–5.1)
Alkaline phosphatase (APISO): 41 U/L (ref 35–144)
BUN: 18 mg/dL (ref 7–25)
CO2: 25 mmol/L (ref 20–32)
Calcium: 9.9 mg/dL (ref 8.6–10.3)
Chloride: 103 mmol/L (ref 98–110)
Creat: 0.87 mg/dL (ref 0.70–1.33)
Globulin: 2.5 g/dL (calc) (ref 1.9–3.7)
Glucose, Bld: 110 mg/dL — ABNORMAL HIGH (ref 65–99)
Potassium: 4.9 mmol/L (ref 3.5–5.3)
Sodium: 136 mmol/L (ref 135–146)
Total Bilirubin: 1.2 mg/dL (ref 0.2–1.2)
Total Protein: 7 g/dL (ref 6.1–8.1)

## 2018-10-31 LAB — CBC WITH DIFFERENTIAL/PLATELET
Absolute Monocytes: 486 cells/uL (ref 200–950)
Basophils Absolute: 22 cells/uL (ref 0–200)
Basophils Relative: 0.4 %
Eosinophils Absolute: 70 cells/uL (ref 15–500)
Eosinophils Relative: 1.3 %
HCT: 46.1 % (ref 38.5–50.0)
Hemoglobin: 16 g/dL (ref 13.2–17.1)
Lymphs Abs: 2441 cells/uL (ref 850–3900)
MCH: 31.7 pg (ref 27.0–33.0)
MCHC: 34.7 g/dL (ref 32.0–36.0)
MCV: 91.5 fL (ref 80.0–100.0)
MPV: 11.1 fL (ref 7.5–12.5)
Monocytes Relative: 9 %
Neutro Abs: 2381 cells/uL (ref 1500–7800)
Neutrophils Relative %: 44.1 %
Platelets: 169 10*3/uL (ref 140–400)
RBC: 5.04 10*6/uL (ref 4.20–5.80)
RDW: 12.2 % (ref 11.0–15.0)
Total Lymphocyte: 45.2 %
WBC: 5.4 10*3/uL (ref 3.8–10.8)

## 2018-10-31 LAB — LIPID PANEL
Cholesterol: 157 mg/dL (ref <200)
HDL: 51 mg/dL (ref >=40)
LDL Cholesterol (Calc): 90 mg/dL (calc)
Non-HDL Cholesterol (Calc): 106 mg/dL (calc) (ref <130)
Total CHOL/HDL Ratio: 3.1 (calc) (ref <5.0)
Triglycerides: 71 mg/dL (ref <150)

## 2018-10-31 LAB — PSA: PSA: 1 ng/mL (ref <=4.0)

## 2018-11-04 ENCOUNTER — Ambulatory Visit: Payer: BLUE CROSS/BLUE SHIELD | Admitting: Family Medicine

## 2018-11-04 ENCOUNTER — Other Ambulatory Visit: Payer: Self-pay

## 2018-11-04 ENCOUNTER — Encounter: Payer: Self-pay | Admitting: Family Medicine

## 2018-11-04 VITALS — BP 138/90 | HR 98 | Temp 98.8°F | Resp 14 | Ht 68.5 in | Wt 234.0 lb

## 2018-11-04 DIAGNOSIS — I10 Essential (primary) hypertension: Secondary | ICD-10-CM

## 2018-11-04 DIAGNOSIS — Z125 Encounter for screening for malignant neoplasm of prostate: Secondary | ICD-10-CM

## 2018-11-04 DIAGNOSIS — Z0000 Encounter for general adult medical examination without abnormal findings: Secondary | ICD-10-CM

## 2018-11-04 DIAGNOSIS — Z0001 Encounter for general adult medical examination with abnormal findings: Secondary | ICD-10-CM

## 2018-11-04 NOTE — Progress Notes (Signed)
Subjective:    Patient ID: Paul Lynn, male    DOB: 30-Sep-1965, 53 y.o.   MRN: [id number]  HPI  Patient is here today for complete physical exam. He has no medical concerns.  Patient had a colonoscopy in 2018 that came back completely normal.  He is not due again until 2028.  Patient had a Tdap last year.  Otherwise his immunizations are up-to-date except for a flu shot this fall.  Past medical history is significant for gout, hypertension, and prediabetes however his blood sugar this year is much better than last year.  The remainder of his lab work is listed below Lab on 10/30/2018  Component Date Value Ref Range Status   Glucose, Bld 10/30/2018 110* 65 - 99 mg/dL Final   Comment: .            Fasting reference interval . For someone without known diabetes, a glucose value between 100 and 125 mg/dL is consistent with prediabetes and should be confirmed with a follow-up test. .    BUN 10/30/2018 18  7 - 25 mg/dL Final   Creat 40/98/119107/05/2018 0.87  0.70 - 1.33 mg/dL Final   Comment: For patients >53 years of age, the reference limit for Creatinine is approximately 13% higher for people identified as African-American. .    BUN/Creatinine Ratio 10/30/2018 NOT APPLICABLE  6 - 22 (calc) Final   Sodium 10/30/2018 136  135 - 146 mmol/L Final   Potassium 10/30/2018 4.9  3.5 - 5.3 mmol/L Final   Chloride 10/30/2018 103  98 - 110 mmol/L Final   CO2 10/30/2018 25  20 - 32 mmol/L Final   Calcium 10/30/2018 9.9  8.6 - 10.3 mg/dL Final   Total Protein 47/82/956207/05/2018 7.0  6.1 - 8.1 g/dL Final   Albumin 13/08/657807/05/2018 4.5  3.6 - 5.1 g/dL Final   Globulin 46/96/295207/05/2018 2.5  1.9 - 3.7 g/dL (calc) Final   AG Ratio 10/30/2018 1.8  1.0 - 2.5 (calc) Final   Total Bilirubin 10/30/2018 1.2  0.2 - 1.2 mg/dL Final   Alkaline phosphatase (APISO) 10/30/2018 41  35 - 144 U/L Final   AST 10/30/2018 20  10 - 35 U/L Final   ALT 10/30/2018 20  9 - 46 U/L Final   WBC 10/30/2018 5.4  3.8 - 10.8  Thousand/uL Final   RBC 10/30/2018 5.04  4.20 - 5.80 Million/uL Final   Hemoglobin 10/30/2018 16.0  13.2 - 17.1 g/dL Final   HCT 84/13/244007/05/2018 46.1  38.5 - 50.0 % Final   MCV 10/30/2018 91.5  80.0 - 100.0 fL Final   MCH 10/30/2018 31.7  27.0 - 33.0 pg Final   MCHC 10/30/2018 34.7  32.0 - 36.0 g/dL Final   RDW 10/27/253607/05/2018 12.2  11.0 - 15.0 % Final   Platelets 10/30/2018 169  140 - 400 Thousand/uL Final   MPV 10/30/2018 11.1  7.5 - 12.5 fL Final   Neutro Abs 10/30/2018 2,381  1,500 - 7,800 cells/uL Final   Lymphs Abs 10/30/2018 2,441  850 - 3,900 cells/uL Final   Absolute Monocytes 10/30/2018 486  200 - 950 cells/uL Final   Eosinophils Absolute 10/30/2018 70  15 - 500 cells/uL Final   Basophils Absolute 10/30/2018 22  0 - 200 cells/uL Final   Neutrophils Relative % 10/30/2018 44.1  % Final   Total Lymphocyte 10/30/2018 45.2  % Final   Monocytes Relative 10/30/2018 9.0  % Final   Eosinophils Relative 10/30/2018 1.3  % Final   Basophils Relative 10/30/2018  0.4  % Final   Cholesterol 10/30/2018 157  <200 mg/dL Final   HDL 10/30/2018 51  > OR = 40 mg/dL Final   Triglycerides 10/30/2018 71  <150 mg/dL Final   LDL Cholesterol (Calc) 10/30/2018 90  mg/dL (calc) Final   Comment: Reference range: <100 . Desirable range <100 mg/dL for primary prevention;   <70 mg/dL for patients with CHD or diabetic patients  with > or = 2 CHD risk factors. Marland Kitchen LDL-C is now calculated using the Martin-Hopkins  calculation, which is a validated novel method providing  better accuracy than the Friedewald equation in the  estimation of LDL-C.  Cresenciano Genre et al. Annamaria Helling. 7829;562(13): 2061-2068  (http://education.[facility].com/faq/FAQ164)    Total CHOL/HDL Ratio 10/30/2018 3.1  <5.0 (calc) Final   Non-HDL Cholesterol (Calc) 10/30/2018 106  <130 mg/dL (calc) Final   Comment: For patients with diabetes plus 1 major ASCVD risk  factor, treating to a non-HDL-C goal of <100 mg/dL  (LDL-C of  <70 mg/dL) is considered a therapeutic  option.    PSA 10/30/2018 1.0  < OR = 4.0 ng/mL Final   Comment: The total PSA value from this assay system is  standardized against the WHO standard. The test  result will be approximately 20% lower when compared  to the equimolar-standardized total PSA (Beckman  Coulter). Comparison of serial PSA results should be  interpreted with this fact in mind. . This test was performed using the Siemens  chemiluminescent method. Values obtained from  different assay methods cannot be used interchangeably. PSA levels, regardless of value, should not be interpreted as absolute evidence of the presence or absence of disease.    Past Medical History:  Diagnosis Date   Gout    Hypertension    Past Surgical History:  Procedure Laterality Date   WISDOM TOOTH EXTRACTION     Current Outpatient Medications on File Prior to Visit  Medication Sig Dispense Refill   allopurinol (ZYLOPRIM) 100 MG tablet TAKE 3 TABLETS BY MOUTH EVERY DAY 270 tablet 2   aspirin EC 81 MG tablet Take 81 mg by mouth daily.     indomethacin (INDOCIN) 50 MG capsule Take 1 capsule (50 mg total) by mouth 2 (two) times daily with a meal. 60 capsule 2   lisinopril (PRINIVIL,ZESTRIL) 20 MG tablet TAKE 1 TABLET BY MOUTH EVERY DAY 90 tablet 3   Current Facility-Administered Medications on File Prior to Visit  Medication Dose Route Frequency Provider Last Rate Last Dose   0.9 %  sodium chloride infusion  500 mL Intravenous Continuous Milus Banister, MD       No Known Allergies Social History   Socioeconomic History   Marital status: Married    Spouse name: Not on file   Number of children: Not on file   Years of education: Not on file   Highest education level: Not on file  Occupational History   Not on file  Social Needs   Financial resource strain: Not on file   Food insecurity    Worry: Not on file    Inability: Not on file   Transportation needs     Medical: Not on file    Non-medical: Not on file  Tobacco Use   Smoking status: Never Smoker   Smokeless tobacco: Never Used  Substance and Sexual Activity   Alcohol use: Yes    Comment: occasional   Drug use: No   Sexual activity: Yes  Lifestyle   Physical activity    Days  per week: Not on file    Minutes per session: Not on file   Stress: Not on file  Relationships   Social connections    Talks on phone: Not on file    Gets together: Not on file    Attends religious service: Not on file    Active member of club or organization: Not on file    Attends meetings of clubs or organizations: Not on file    Relationship status: Not on file   Intimate partner violence    Fear of current or ex partner: Not on file    Emotionally abused: Not on file    Physically abused: Not on file    Forced sexual activity: Not on file  Other Topics Concern   Not on file  Social History Narrative   Not on file   Family History  Problem Relation Age of Onset   Cancer Father        prostate   Prostate cancer Father    Cancer Paternal Uncle        prostate   Prostate cancer Paternal Uncle    Colon cancer Neg Hx    Colon polyps Neg Hx    Esophageal cancer Neg Hx    Rectal cancer Neg Hx    Stomach cancer Neg Hx      Review of Systems  All other systems reviewed and are negative.      Objective:   Physical Exam  Constitutional: He is oriented to person, place, and time. He appears well-developed and well-nourished. No distress.  HENT:  Head: Normocephalic and atraumatic.  Right Ear: External ear normal.  Left Ear: External ear normal.  Nose: Nose normal.  Mouth/Throat: Oropharynx is clear and moist. No oropharyngeal exudate.  Eyes: Pupils are equal, round, and reactive to light. Conjunctivae and EOM are normal. Right eye exhibits no discharge. Left eye exhibits no discharge. No scleral icterus.  Neck: Normal range of motion. Neck supple. No JVD present. No  tracheal deviation present. No thyromegaly present.  Cardiovascular: Normal rate, regular rhythm, normal heart sounds and intact distal pulses. Exam reveals no gallop and no friction rub.  No murmur heard. Pulmonary/Chest: Effort normal and breath sounds normal. No stridor. No respiratory distress. He has no wheezes. He has no rales. He exhibits no tenderness.  Abdominal: Soft. Bowel sounds are normal. He exhibits no distension and no mass. There is no abdominal tenderness. There is no rebound and no guarding.  Musculoskeletal: Normal range of motion.        General: No tenderness or edema.  Lymphadenopathy:    He has no cervical adenopathy.  Neurological: He is alert and oriented to person, place, and time. He displays normal reflexes. No cranial nerve deficit. He exhibits normal muscle tone. Coordination normal.  Skin: Skin is warm. No rash noted. He is not diaphoretic. No erythema. No pallor.  Psychiatric: He has a normal mood and affect. His behavior is normal. Judgment and thought content normal.  Vitals reviewed.        Assessment & Plan:  The primary encounter diagnosis was General medical exam. Diagnoses of Benign essential HTN and Screening PSA (prostate specific antigen) were also pertinent to this visit. Physical exam today is normal except for an elevated fasting blood sugar in the prediabetic range although better than last year and also an elevated BMI.  I encouraged a low carbohydrate diet avoiding starchy foods such as bread rice potatoes pasta etc.  Also recommended aerobic exercise  and weight loss.  Blood pressure is reasonable.  The remainder of his lab work is outstanding.  PSA is stable and colonoscopy is up-to-date.  Regular anticipatory medical guidance is provided

## 2019-01-04 ENCOUNTER — Other Ambulatory Visit: Payer: Self-pay | Admitting: Family Medicine

## 2019-06-12 ENCOUNTER — Other Ambulatory Visit: Payer: Self-pay | Admitting: Family Medicine

## 2020-01-21 ENCOUNTER — Other Ambulatory Visit: Payer: Self-pay | Admitting: Family Medicine

## 2020-08-04 ENCOUNTER — Other Ambulatory Visit: Payer: Self-pay | Admitting: *Deleted

## 2020-08-04 MED ORDER — ALLOPURINOL 100 MG PO TABS: 300.0000 mg | ORAL_TABLET | Freq: Every day | ORAL | 0 refills | Status: DC

## 2020-08-28 ENCOUNTER — Other Ambulatory Visit: Payer: Self-pay | Admitting: Family Medicine

## 2020-09-25 ENCOUNTER — Other Ambulatory Visit: Payer: Self-pay | Admitting: Family Medicine

## 2020-10-26 ENCOUNTER — Other Ambulatory Visit: Payer: Self-pay | Admitting: Family Medicine

## 2020-11-26 ENCOUNTER — Other Ambulatory Visit: Payer: Self-pay | Admitting: Family Medicine

## 2021-01-02 ENCOUNTER — Other Ambulatory Visit: Payer: Self-pay | Admitting: Family Medicine

## 2021-01-10 ENCOUNTER — Other Ambulatory Visit: Payer: Self-pay

## 2021-01-10 ENCOUNTER — Other Ambulatory Visit: Payer: BC Managed Care – PPO

## 2021-01-10 DIAGNOSIS — Z136 Encounter for screening for cardiovascular disorders: Secondary | ICD-10-CM

## 2021-01-10 DIAGNOSIS — Z1159 Encounter for screening for other viral diseases: Secondary | ICD-10-CM | POA: Diagnosis not present

## 2021-01-10 DIAGNOSIS — Z13220 Encounter for screening for lipoid disorders: Secondary | ICD-10-CM

## 2021-01-10 DIAGNOSIS — Z125 Encounter for screening for malignant neoplasm of prostate: Secondary | ICD-10-CM

## 2021-01-11 LAB — CBC WITH DIFFERENTIAL/PLATELET
Absolute Monocytes: 643 cells/uL (ref 200–950)
Basophils Absolute: 41 cells/uL (ref 0–200)
Basophils Relative: 0.7 %
Eosinophils Absolute: 130 cells/uL (ref 15–500)
Eosinophils Relative: 2.2 %
HCT: 48.5 % (ref 38.5–50.0)
Hemoglobin: 16.2 g/dL (ref 13.2–17.1)
Lymphs Abs: 2531 cells/uL (ref 850–3900)
MCH: 32.1 pg (ref 27.0–33.0)
MCHC: 33.4 g/dL (ref 32.0–36.0)
MCV: 96 fL (ref 80.0–100.0)
MPV: 11.6 fL (ref 7.5–12.5)
Monocytes Relative: 10.9 %
Neutro Abs: 2555 cells/uL (ref 1500–7800)
Neutrophils Relative %: 43.3 %
Platelets: 158 10*3/uL (ref 140–400)
RBC: 5.05 10*6/uL (ref 4.20–5.80)
RDW: 11.8 % (ref 11.0–15.0)
Total Lymphocyte: 42.9 %
WBC: 5.9 10*3/uL (ref 3.8–10.8)

## 2021-01-11 LAB — LIPID PANEL
Cholesterol: 142 mg/dL (ref <200)
HDL: 49 mg/dL (ref >=40)
LDL Cholesterol (Calc): 73 mg/dL (calc)
Non-HDL Cholesterol (Calc): 93 mg/dL (calc) (ref <130)
Total CHOL/HDL Ratio: 2.9 (calc) (ref <5.0)
Triglycerides: 115 mg/dL (ref <150)

## 2021-01-11 LAB — COMPLETE METABOLIC PANEL WITH GFR
AG Ratio: 1.7 (calc) (ref 1.0–2.5)
ALT: 28 U/L (ref 9–46)
AST: 19 U/L (ref 10–35)
Albumin: 4.5 g/dL (ref 3.6–5.1)
Alkaline phosphatase (APISO): 40 U/L (ref 35–144)
BUN: 20 mg/dL (ref 7–25)
CO2: 23 mmol/L (ref 20–32)
Calcium: 9.6 mg/dL (ref 8.6–10.3)
Chloride: 103 mmol/L (ref 98–110)
Creat: 0.82 mg/dL (ref 0.70–1.30)
Globulin: 2.6 g/dL (calc) (ref 1.9–3.7)
Glucose, Bld: 104 mg/dL — ABNORMAL HIGH (ref 65–99)
Potassium: 4.3 mmol/L (ref 3.5–5.3)
Sodium: 137 mmol/L (ref 135–146)
Total Bilirubin: 0.8 mg/dL (ref 0.2–1.2)
Total Protein: 7.1 g/dL (ref 6.1–8.1)
eGFR: 104 mL/min/{1.73_m2} (ref >=60)

## 2021-01-11 LAB — HEPATITIS C ANTIBODY
Hepatitis C Ab: NONREACTIVE
SIGNAL TO CUT-OFF: 0.01 (ref <1.00)

## 2021-01-11 LAB — PSA: PSA: 0.77 ng/mL (ref <=4.00)

## 2021-01-16 ENCOUNTER — Other Ambulatory Visit: Payer: Self-pay

## 2021-01-16 ENCOUNTER — Encounter: Payer: Self-pay | Admitting: Family Medicine

## 2021-01-16 ENCOUNTER — Ambulatory Visit (INDEPENDENT_AMBULATORY_CARE_PROVIDER_SITE_OTHER): Payer: BC Managed Care – PPO | Admitting: Family Medicine

## 2021-01-16 VITALS — BP 118/78 | HR 66 | Ht 68.5 in | Wt 242.8 lb

## 2021-01-16 DIAGNOSIS — G5601 Carpal tunnel syndrome, right upper limb: Secondary | ICD-10-CM | POA: Diagnosis not present

## 2021-01-16 DIAGNOSIS — Z0000 Encounter for general adult medical examination without abnormal findings: Secondary | ICD-10-CM | POA: Diagnosis not present

## 2021-01-16 NOTE — Progress Notes (Signed)
Subjective:    Patient ID: Paul Lynn, male    DOB: December 13, 1965, 55 y.o.   MRN: [id number]  HPI  Patient is here today for complete physical exam. He has no medical concerns.  Patient had a colonoscopy in 2018 that came back completely normal.  He is not due again until 2028.  He is due for a flu shot, a booster on his COVID vaccination, and the shingles vaccine.  We discussed these today.  Of note, there is significant atrophy in the webspace between his right thumb and his right index finger.  He notes that he is losing grip strength in his right hand.  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  I believe that he is likely developing significant median neuropathy.  He denies any numbness in his hands.  He denies any weakness in his upper arms or strokelike symptoms.  The remainder of his lab work is listed below Lab on 01/10/2021  Component Date Value Ref Range Status   WBC 01/10/2021 5.9  3.8 - 10.8 Thousand/uL Final   RBC 01/10/2021 5.05  4.20 - 5.80 Million/uL Final   Hemoglobin 01/10/2021 16.2  13.2 - 17.1 g/dL Final   HCT 09/90/6893 48.5  38.5 - 50.0 % Final   MCV 01/10/2021 96.0  80.0 - 100.0 fL Final   MCH 01/10/2021 32.1  27.0 - 33.0 pg Final   MCHC 01/10/2021 33.4  32.0 - 36.0 g/dL Final   RDW 40/68/4033 11.8  11.0 - 15.0 % Final   Platelets 01/10/2021 158  140 - 400 Thousand/uL Final   MPV 01/10/2021 11.6  7.5 - 12.5 fL Final   Neutro Abs 01/10/2021 2,555  1,500 - 7,800 cells/uL Final   Lymphs Abs 01/10/2021 2,531  850 - 3,900 cells/uL Final   Absolute Monocytes 01/10/2021 643  200 - 950 cells/uL Final   Eosinophils Absolute 01/10/2021 130  15 - 500 cells/uL Final   Basophils Absolute 01/10/2021 41  0 - 200 cells/uL Final   Neutrophils Relative % 01/10/2021 43.3  % Final   Total Lymphocyte 01/10/2021 42.9  % Final   Monocytes Relative 01/10/2021 10.9  % Final   Eosinophils Relative 01/10/2021 2.2  % Final    Basophils Relative 01/10/2021 0.7  % Final   Glucose, Bld 01/10/2021 104 (A) 65 - 99 mg/dL Final   Comment: .            Fasting reference interval . For someone without known diabetes, a glucose value between 100 and 125 mg/dL is consistent with prediabetes and should be confirmed with a follow-up test. .    BUN 01/10/2021 20  7 - 25 mg/dL Final   Creat 53/31/7409 0.82  0.70 - 1.30 mg/dL Final   eGFR 92/78/0044 104  > OR = 60 mL/min/1.30m2 Final   Comment: The eGFR is based on the CKD-EPI 2021 equation. To calculate  the new eGFR from a previous Creatinine or Cystatin C result, go to https://www.kidney.org/professionals/ kdoqi/gfr%5Fcalculator    BUN/Creatinine Ratio 01/10/2021 NOT APPLICABLE  6 - 22 (calc) Final   Sodium 01/10/2021 137  135 - 146 mmol/L Final   Potassium 01/10/2021 4.3  3.5 - 5.3 mmol/L Final   Chloride 01/10/2021 103  98 - 110 mmol/L Final   CO2 01/10/2021 23  20 - 32 mmol/L Final   Calcium 01/10/2021 9.6  8.6 - 10.3 mg/dL Final   Total Protein 71/58/0638 7.1  6.1 - 8.1  g/dL Final   Albumin 01/10/2021 4.5  3.6 - 5.1 g/dL Final   Globulin 01/10/2021 2.6  1.9 - 3.7 g/dL (calc) Final   AG Ratio 01/10/2021 1.7  1.0 - 2.5 (calc) Final   Total Bilirubin 01/10/2021 0.8  0.2 - 1.2 mg/dL Final   Alkaline phosphatase (APISO) 01/10/2021 40  35 - 144 U/L Final   AST 01/10/2021 19  10 - 35 U/L Final   ALT 01/10/2021 28  9 - 46 U/L Final   Hepatitis C Ab 01/10/2021 NON-REACTIVE  NON-REACTIVE Final   SIGNAL TO CUT-OFF 01/10/2021 0.01  <1.00 Final   Comment: . HCV antibody was non-reactive. There is no laboratory  evidence of HCV infection. . In most cases, no further action is required. However, if recent HCV exposure is suspected, a test for HCV RNA (test code [phone]) is suggested. . For additional information please refer to http://education.[facility].com/faq/FAQ22v1 (This link is being provided for informational/ educational purposes only.) .     Cholesterol 01/10/2021 142  <200 mg/dL Final   HDL 01/10/2021 49  > OR = 40 mg/dL Final   Triglycerides 01/10/2021 115  <150 mg/dL Final   LDL Cholesterol (Calc) 01/10/2021 73  mg/dL (calc) Final   Comment: Reference range: <100 . Desirable range <100 mg/dL for primary prevention;   <70 mg/dL for patients with CHD or diabetic patients  with > or = 2 CHD risk factors. Marland Kitchen LDL-C is now calculated using the Martin-Hopkins  calculation, which is a validated novel method providing  better accuracy than the Friedewald equation in the  estimation of LDL-C.  Cresenciano Genre et al. Annamaria Helling. 9323;557(32): 2061-2068  (http://education.[facility].com/faq/FAQ164)    Total CHOL/HDL Ratio 01/10/2021 2.9  <5.0 (calc) Final   Non-HDL Cholesterol (Calc) 01/10/2021 93  <130 mg/dL (calc) Final   Comment: For patients with diabetes plus 1 major ASCVD risk  factor, treating to a non-HDL-C goal of <100 mg/dL  (LDL-C of <70 mg/dL) is considered a therapeutic  option.    PSA 01/10/2021 0.77  < OR = 4.00 ng/mL Final   Comment: The total PSA value from this assay system is  standardized against the WHO standard. The test  result will be approximately 20% lower when compared  to the equimolar-standardized total PSA (Beckman  Coulter). Comparison of serial PSA results should be  interpreted with this fact in mind. . This test was performed using the Siemens  chemiluminescent method. Values obtained from  different assay methods cannot be used interchangeably. PSA levels, regardless of value, should not be interpreted as absolute evidence of the presence or absence of disease.    Past Medical History:  Diagnosis Date   Gout    Hypertension    Past Surgical History:  Procedure Laterality Date   WISDOM TOOTH EXTRACTION     Current Outpatient Medications on File Prior to Visit  Medication Sig Dispense Refill   allopurinol (ZYLOPRIM) 100 MG tablet TAKE 3 TABLETS BY MOUTH DAILY 90 tablet 0   aspirin EC  81 MG tablet Take 81 mg by mouth daily.     indomethacin (INDOCIN) 50 MG capsule Take 1 capsule (50 mg total) by mouth 2 (two) times daily with a meal. 60 capsule 2   lisinopril (ZESTRIL) 20 MG tablet TAKE 1 TABLET BY MOUTH EVERY DAY 90 tablet 3   Current Facility-Administered Medications on File Prior to Visit  Medication Dose Route Frequency Provider Last Rate Last Admin   0.9 %  sodium chloride infusion  500 mL Intravenous  Continuous Milus Banister, MD       No Known Allergies Social History   Socioeconomic History   Marital status: Married    Spouse name: Not on file   Number of children: Not on file   Years of education: Not on file   Highest education level: Not on file  Occupational History   Not on file  Tobacco Use   Smoking status: Never   Smokeless tobacco: Never  Vaping Use   Vaping Use: Never used  Substance and Sexual Activity   Alcohol use: Yes    Comment: occasional   Drug use: No   Sexual activity: Yes  Other Topics Concern   Not on file  Social History Narrative   Not on file   Social Determinants of Health   Financial Resource Strain: Not on file  Food Insecurity: Not on file  Transportation Needs: Not on file  Physical Activity: Not on file  Stress: Not on file  Social Connections: Not on file  Intimate Partner Violence: Not on file   Family History  Problem Relation Age of Onset   Cancer Father        prostate   Prostate cancer Father    Cancer Paternal Uncle        prostate   Prostate cancer Paternal Uncle    Colon cancer Neg Hx    Colon polyps Neg Hx    Esophageal cancer Neg Hx    Rectal cancer Neg Hx    Stomach cancer Neg Hx      Review of Systems  All other systems reviewed and are negative.     Objective:   Physical Exam Vitals reviewed.  Constitutional:      General: He is not in acute distress.    Appearance: He is well-developed. He is not diaphoretic.  HENT:     Head: Normocephalic and atraumatic.     Right Ear:  External ear normal.     Left Ear: External ear normal.     Nose: Nose normal.     Mouth/Throat:     Pharynx: No oropharyngeal exudate.  Eyes:     General: No scleral icterus.       Right eye: No discharge.        Left eye: No discharge.     Conjunctiva/sclera: Conjunctivae normal.     Pupils: Pupils are equal, round, and reactive to light.  Neck:     Thyroid: No thyromegaly.     Vascular: No JVD.     Trachea: No tracheal deviation.  Cardiovascular:     Rate and Rhythm: Normal rate and regular rhythm.     Heart sounds: Normal heart sounds. No murmur heard.   No friction rub. No gallop.  Pulmonary:     Effort: Pulmonary effort is normal. No respiratory distress.     Breath sounds: Normal breath sounds. No stridor. No wheezing or rales.  Chest:     Chest wall: No tenderness.  Abdominal:     General: Bowel sounds are normal. There is no distension.     Palpations: Abdomen is soft. There is no mass.     Tenderness: There is no abdominal tenderness. There is no guarding or rebound.  Musculoskeletal:        General: No tenderness. Normal range of motion.     Cervical back: Normal range of motion and neck supple.  Lymphadenopathy:     Cervical: No cervical adenopathy.  Skin:    General: Skin is  warm.     Coloration: Skin is not pale.     Findings: No erythema or rash.  Neurological:     Mental Status: He is alert and oriented to person, place, and time.     Cranial Nerves: No cranial nerve deficit.     Motor: No abnormal muscle tone.     Coordination: Coordination normal.     Deep Tendon Reflexes: Reflexes normal.  Psychiatric:        Behavior: Behavior normal.        Thought Content: Thought content normal.        Judgment: Judgment normal.   Significant atrophy in the webspace between the right thumb and the right index finger.  Decrease in grip strength in the right hand      Assessment & Plan:  Carpal tunnel syndrome of right wrist - Plan: Ambulatory referral to Hand  Surgery  General medical exam I believe the patient has developed profound median neuropathy in his right hand due to his occupation.  Given the fact that he is losing grip strength and has muscle atrophy, I do not feel conservative treatments are warranted.  Instead I recommended that he meet with a hand surgeon immediately to discuss his options to avoid permanent muscle weakness.  They may elect to perform nerve conduction studies to delineate further.  Lab work is outstanding.  Colonoscopy is up-to-date.  Blood pressure is excellent.  The remainder of his physical exam is normal.  Blood sugar is better than previous.  I did recommend a low carbohydrate diet.

## 2021-01-20 ENCOUNTER — Encounter: Payer: Self-pay | Admitting: Orthopedic Surgery

## 2021-01-20 ENCOUNTER — Other Ambulatory Visit: Payer: Self-pay

## 2021-01-20 ENCOUNTER — Ambulatory Visit (INDEPENDENT_AMBULATORY_CARE_PROVIDER_SITE_OTHER): Payer: BC Managed Care – PPO | Admitting: Orthopedic Surgery

## 2021-01-20 VITALS — BP 137/83 | HR 75 | Ht 68.5 in | Wt 243.0 lb

## 2021-01-20 DIAGNOSIS — G5622 Lesion of ulnar nerve, left upper limb: Secondary | ICD-10-CM | POA: Diagnosis not present

## 2021-01-20 DIAGNOSIS — G5621 Lesion of ulnar nerve, right upper limb: Secondary | ICD-10-CM | POA: Insufficient documentation

## 2021-01-20 DIAGNOSIS — G5623 Lesion of ulnar nerve, bilateral upper limbs: Secondary | ICD-10-CM

## 2021-01-20 NOTE — Progress Notes (Signed)
Office Visit Note   Patient: Paul Lynn           Date of Birth: 1965/07/11           MRN: [id number] Visit Date: 01/20/2021              Requested by: [name], MD [id number] [facility] [address] [place],  [place] [address] PCP: [name], MD   Assessment & Plan: Visit Diagnoses:  1. Ulnar neuropathy of left upper extremity   2. Ulnar neuropathy of right upper extremity     Plan: Discussed with patient that he has a significant ulnar neuropathy given the extent of his intrinsic atrophy.  He denies any numbness or paresthesias or other symptoms.  He just noticed the atrophy in the setting of decreased grip strength.  We will get an EMG/NCS to further evaluate the neuropathy.   Follow-Up Instructions: No follow-ups on file.   Orders:  Orders Placed This Encounter  Procedures   Ambulatory referral to Physical Medicine Rehab   No orders of the defined types were placed in this encounter.     Procedures: No procedures performed   Clinical Data: No additional findings.   Subjective: Chief Complaint  Patient presents with   Right Hand - New Patient (Initial Visit)    This is a 55 yo RHD M who presents with R hand weakness.  He works as a Psychologist, occupational and noticed that his grip has gotten weaker on the R side.  He has also noticed atrophy of his hand in the area of the FDI. Some days he has difficulty with buttons on his shirt.  He denies ever having numbness or tingling in his fingers.  He denies any previous issues with his c-spine.  He denies any hand pain.  He denies weakness in his left hand or bilateral lower extremities.    Review of Systems  Constitutional: Negative.   Respiratory: Negative.    Cardiovascular: Negative.   Skin: Negative.   Neurological:  Positive for weakness.    Objective: Vital Signs: BP 137/83 (BP Location: Left Arm, Patient Position: Sitting, Cuff Size: Normal)   Pulse 75   Ht 5' 8.5" (1.74 m)   Wt 243 lb (110.2 kg)   SpO2 95%    BMI 36.41 kg/m   Physical Exam Cardiovascular:     Rate and Rhythm: Normal rate.     Pulses: Normal pulses.  Pulmonary:     Effort: Pulmonary effort is normal.  Musculoskeletal:     Cervical back: Normal range of motion.  Skin:    General: Skin is warm and dry.     Capillary Refill: Capillary refill takes less than 2 seconds.  Neurological:     Mental Status: He is alert.    Right Hand Exam   Comments:  Significant atrophy of the intrinsics, particularly the FDI.  No thenar atrophy.  Minimal active finger abduction.  4/5 FDP to ring and small fingers.  + Tinel at elbow and Guyon's canal.      Specialty Comments:  No specialty comments available.  Imaging: No results found.   PMFS History: Patient Active Problem List   Diagnosis Date Noted   Ulnar neuropathy of right upper extremity 01/20/2021   Gout    Past Medical History:  Diagnosis Date   Gout    Hypertension     Family History  Problem Relation Age of Onset   Cancer Father  prostate   Prostate cancer Father    Cancer Paternal Uncle        prostate   Prostate cancer Paternal Uncle    Colon cancer Neg Hx    Colon polyps Neg Hx    Esophageal cancer Neg Hx    Rectal cancer Neg Hx    Stomach cancer Neg Hx     Past Surgical History:  Procedure Laterality Date   WISDOM TOOTH EXTRACTION     Social History   Occupational History   Not on file  Tobacco Use   Smoking status: Never   Smokeless tobacco: Never  Vaping Use   Vaping Use: Never used  Substance and Sexual Activity   Alcohol use: Yes    Comment: occasional   Drug use: No   Sexual activity: Yes

## 2021-01-29 ENCOUNTER — Other Ambulatory Visit: Payer: Self-pay | Admitting: Family Medicine

## 2021-02-08 ENCOUNTER — Other Ambulatory Visit: Payer: Self-pay

## 2021-02-08 ENCOUNTER — Ambulatory Visit: Payer: BC Managed Care – PPO | Admitting: Physical Medicine and Rehabilitation

## 2021-02-08 DIAGNOSIS — R202 Paresthesia of skin: Secondary | ICD-10-CM | POA: Diagnosis not present

## 2021-02-08 NOTE — Progress Notes (Signed)
Loss of muscle right hand. Weakened grip. No numbness, pain, or tingling. Some right elbow pain. Right hand dominant No lotion per patient

## 2021-02-10 NOTE — Procedures (Signed)
EMG & NCV Findings: Evaluation of the right median motor nerve showed prolonged distal onset latency (5.5 ms), reduced amplitude (1.1 mV), and decreased conduction velocity (Elbow-Wrist, 46 m/s).  The right ulnar motor nerve showed prolonged distal onset latency (4.3 ms) and decreased conduction velocity (A Elbow-B Elbow, 44 m/s).  The right median (across palm) sensory nerve showed prolonged distal peak latency (Wrist, 4.9 ms) and prolonged distal peak latency (Palm, 2.4 ms).  The right ulnar sensory nerve showed prolonged distal peak latency (4.5 ms), reduced amplitude (0.3 V), and decreased conduction velocity (Wrist-5th Digit, 31 m/s).  All remaining nerves (as indicated in the following tables) were within normal limits.    Needle evaluation of the right abductor pollicis brevis muscle showed increased insertional activity, moderately increased spontaneous activity, and diminished recruitment.  The right first dorsal interosseous muscle showed increased insertional activity, widespread spontaneous activity, increased motor unit amplitude, and diminished recruitment.  All remaining muscles (as indicated in the following table) showed no evidence of electrical instability.    Impression: The above electrodiagnostic study is ABNORMAL and reveals evidence of:  a severe right Ulnar nerve entrapment at the elbow (cubitall tunnel syndrome) affecting sensory and motor components. The lesion is characterized by sensory and motor demyelination with evidence of significant axonal injury.  Likely to have some residual nerve impairment despite appropriate decompression.  a severe right Median nerve entrapment at the wrist (carpal tunnel syndrome) affecting sensory and motor components.  **Nerve conductions do clearly show conduction velocity slowing as well as amplitude drops across the cubital and carpal tunnel however a C8 radiculopathy cannot be totally excluded.  There is no significant electrodiagnostic  evidence of any other focal nerve entrapment, brachial plexopathy or generalized peripheral neuropathy.   Recommendations: 1.  Follow-up with referring physician. 2.  Continue current management of symptoms. 3.  Suggest surgical evaluation.  ___________________________ Naaman Plummer FAAPMR Board Certified, American Board of Physical Medicine and Rehabilitation    Nerve Conduction Studies Anti Sensory Summary Table   Stim Site NR Peak (ms) Norm Peak (ms) P-T Amp (V) Norm P-T Amp Site1 Site2 Delta-P (ms) Dist (cm) Vel (m/s) Norm Vel (m/s)  Right Median Acr Palm Anti Sensory (2nd Digit)  31.3C  Wrist    *4.9 <3.6 18.0 >10 Wrist Palm 2.5 0.0    Palm    *2.4 <2.0 20.8         Right Radial Anti Sensory (Base 1st Digit)  31.7C  Wrist    2.3 <3.1 20.3  Wrist Base 1st Digit 2.3 0.0    Right Ulnar Anti Sensory (5th Digit)  31.4C  Wrist    *4.5 <3.7 *0.3 >15.0 Wrist 5th Digit 4.5 14.0 *31 >38   Motor Summary Table   Stim Site NR Onset (ms) Norm Onset (ms) O-P Amp (mV) Norm O-P Amp Site1 Site2 Delta-0 (ms) Dist (cm) Vel (m/s) Norm Vel (m/s)  Right Median Motor (Abd Poll Brev)  31.3C  Wrist    *5.5 <4.2 *1.1 >5 Elbow Wrist 5.3 24.5 *46 >50  Elbow    10.8  2.2         Right Ulnar Motor (Abd Dig Min)  31.2C  Wrist    *4.3 <4.2 3.2 >3 B Elbow Wrist 3.6 23.0 64 >53  B Elbow    7.9  2.3  A Elbow B Elbow 2.4 10.5 *44 >53  A Elbow    10.3  2.1          EMG   Side Muscle  Nerve Root Ins Act Fibs Psw Amp Dur Poly Recrt Int Dennie Bible Comment  Right Abd Poll Brev Median C8-T1 *Incr *2+ *2+ Nml Nml 0 *Reduced Nml   Right 1stDorInt Ulnar C8-T1 *Incr *4+ *4+ *Incr Nml 0 *Reduced Nml few MUAPS  Right PronatorTeres Median C6-7 Nml Nml Nml Nml Nml 0 Nml Nml   Right Biceps Musculocut C5-6 Nml Nml Nml Nml Nml 0 Nml Nml   Right Deltoid Axillary C5-6 Nml Nml Nml Nml Nml 0 Nml Nml     Nerve Conduction Studies Anti Sensory Left/Right Comparison   Stim Site L Lat (ms) R Lat (ms) L-R Lat (ms) L Amp (V) R  Amp (V) L-R Amp (%) Site1 Site2 L Vel (m/s) R Vel (m/s) L-R Vel (m/s)  Median Acr Palm Anti Sensory (2nd Digit)  31.3C  Wrist  *4.9   18.0  Wrist Palm     Palm  *2.4   20.8        Radial Anti Sensory (Base 1st Digit)  31.7C  Wrist  2.3   20.3  Wrist Base 1st Digit     Ulnar Anti Sensory (5th Digit)  31.4C  Wrist  *4.5   *0.3  Wrist 5th Digit  *31    Motor Left/Right Comparison   Stim Site L Lat (ms) R Lat (ms) L-R Lat (ms) L Amp (mV) R Amp (mV) L-R Amp (%) Site1 Site2 L Vel (m/s) R Vel (m/s) L-R Vel (m/s)  Median Motor (Abd Poll Brev)  31.3C  Wrist  *5.5   *1.1  Elbow Wrist  *46   Elbow  10.8   2.2        Ulnar Motor (Abd Dig Min)  31.2C  Wrist  *4.3   3.2  B Elbow Wrist  64   B Elbow  7.9   2.3  A Elbow B Elbow  *44   A Elbow  10.3   2.1           Waveforms:

## 2021-02-10 NOTE — Progress Notes (Signed)
Paul Lynn - 55 y.o. male MRN [id number]  Date of birth: 10-20-1965  Office Visit Note: Visit Date: 02/08/2021 PCP: [name], MD Referred by: [name], MD  Subjective: Chief Complaint  Patient presents with   Right Hand - Weakness   HPI:  Paul Lynn is a 55 y.o. male who comes in today at the request of Dr. Waylan Rocher for electrodiagnostic study of the Right upper extremities.  Patient is Right hand dominant.  He reports what he felt to be almost sudden onset of atrophy of the FDI muscle on the right.  He had noticed progressive weakness for some time.  He has had off-and-on symptoms in his hand for quite a long time.  He does not endorse much in the way of tingling or pain but he does have a decrease in sensation.  He does get pain in the right elbow.  He denies any frank radicular symptoms or specific neck pain.  No prior cervical surgery.  He is not diabetic.  No prior electrodiagnostic studies to review.  ROS Otherwise per HPI.  Assessment & Plan: Visit Diagnoses:    ICD-10-CM   1. Paresthesia of skin  R20.2 NCV with EMG (electromyography)      Plan: Impression: The above electrodiagnostic study is ABNORMAL and reveals evidence of:  a severe right Ulnar nerve entrapment at the elbow (cubitall tunnel syndrome) affecting sensory and motor components. The lesion is characterized by sensory and motor demyelination with evidence of significant axonal injury.  Likely to have some residual nerve impairment despite appropriate decompression.  a severe right Median nerve entrapment at the wrist (carpal tunnel syndrome) affecting sensory and motor components.  **Nerve conductions do clearly show conduction velocity slowing as well as amplitude drops across the cubital and carpal tunnel however a C8 radiculopathy cannot be totally excluded.  There is no significant electrodiagnostic evidence of any other focal nerve entrapment, brachial plexopathy or  generalized peripheral neuropathy.   Recommendations: 1.  Follow-up with referring physician. 2.  Continue current management of symptoms. 3.  Suggest surgical evaluation.  Meds & Orders: No orders of the defined types were placed in this encounter.   Orders Placed This Encounter  Procedures   NCV with EMG (electromyography)    Follow-up: Return in about 2 weeks (around 02/22/2021) for Waylan Rocher, MD.   Procedures: No procedures performed  EMG & NCV Findings: Evaluation of the right median motor nerve showed prolonged distal onset latency (5.5 ms), reduced amplitude (1.1 mV), and decreased conduction velocity (Elbow-Wrist, 46 m/s).  The right ulnar motor nerve showed prolonged distal onset latency (4.3 ms) and decreased conduction velocity (A Elbow-B Elbow, 44 m/s).  The right median (across palm) sensory nerve showed prolonged distal peak latency (Wrist, 4.9 ms) and prolonged distal peak latency (Palm, 2.4 ms).  The right ulnar sensory nerve showed prolonged distal peak latency (4.5 ms), reduced amplitude (0.3 V), and decreased conduction velocity (Wrist-5th Digit, 31 m/s).  All remaining nerves (as indicated in the following tables) were within normal limits.    Needle evaluation of the right abductor pollicis brevis muscle showed increased insertional activity, moderately increased spontaneous activity, and diminished recruitment.  The right first dorsal interosseous muscle showed increased insertional activity, widespread spontaneous activity, increased motor unit amplitude, and diminished recruitment.  All remaining muscles (as indicated in the following table) showed no evidence of electrical instability.    Impression: The above electrodiagnostic study is ABNORMAL and reveals evidence of:  a severe right Ulnar nerve entrapment at the elbow (cubitall tunnel syndrome) affecting sensory and motor components. The lesion is characterized by sensory and motor demyelination with  evidence of significant axonal injury.  Likely to have some residual nerve impairment despite appropriate decompression.  a severe right Median nerve entrapment at the wrist (carpal tunnel syndrome) affecting sensory and motor components.  **Nerve conductions do clearly show conduction velocity slowing as well as amplitude drops across the cubital and carpal tunnel however a C8 radiculopathy cannot be totally excluded.  There is no significant electrodiagnostic evidence of any other focal nerve entrapment, brachial plexopathy or generalized peripheral neuropathy.   Recommendations: 1.  Follow-up with referring physician. 2.  Continue current management of symptoms. 3.  Suggest surgical evaluation.  ___________________________ Naaman Plummer FAAPMR Board Certified, American Board of Physical Medicine and Rehabilitation    Nerve Conduction Studies Anti Sensory Summary Table   Stim Site NR Peak (ms) Norm Peak (ms) P-T Amp (V) Norm P-T Amp Site1 Site2 Delta-P (ms) Dist (cm) Vel (m/s) Norm Vel (m/s)  Right Median Acr Palm Anti Sensory (2nd Digit)  31.3C  Wrist    *4.9 <3.6 18.0 >10 Wrist Palm 2.5 0.0    Palm    *2.4 <2.0 20.8         Right Radial Anti Sensory (Base 1st Digit)  31.7C  Wrist    2.3 <3.1 20.3  Wrist Base 1st Digit 2.3 0.0    Right Ulnar Anti Sensory (5th Digit)  31.4C  Wrist    *4.5 <3.7 *0.3 >15.0 Wrist 5th Digit 4.5 14.0 *31 >38   Motor Summary Table   Stim Site NR Onset (ms) Norm Onset (ms) O-P Amp (mV) Norm O-P Amp Site1 Site2 Delta-0 (ms) Dist (cm) Vel (m/s) Norm Vel (m/s)  Right Median Motor (Abd Poll Brev)  31.3C  Wrist    *5.5 <4.2 *1.1 >5 Elbow Wrist 5.3 24.5 *46 >50  Elbow    10.8  2.2         Right Ulnar Motor (Abd Dig Min)  31.2C  Wrist    *4.3 <4.2 3.2 >3 B Elbow Wrist 3.6 23.0 64 >53  B Elbow    7.9  2.3  A Elbow B Elbow 2.4 10.5 *44 >53  A Elbow    10.3  2.1          EMG   Side Muscle Nerve Root Ins Act Fibs Psw Amp Dur Poly Recrt Int Dennie Bible Comment   Right Abd Poll Brev Median C8-T1 *Incr *2+ *2+ Nml Nml 0 *Reduced Nml   Right 1stDorInt Ulnar C8-T1 *Incr *4+ *4+ *Incr Nml 0 *Reduced Nml few MUAPS  Right PronatorTeres Median C6-7 Nml Nml Nml Nml Nml 0 Nml Nml   Right Biceps Musculocut C5-6 Nml Nml Nml Nml Nml 0 Nml Nml   Right Deltoid Axillary C5-6 Nml Nml Nml Nml Nml 0 Nml Nml     Nerve Conduction Studies Anti Sensory Left/Right Comparison   Stim Site L Lat (ms) R Lat (ms) L-R Lat (ms) L Amp (V) R Amp (V) L-R Amp (%) Site1 Site2 L Vel (m/s) R Vel (m/s) L-R Vel (m/s)  Median Acr Palm Anti Sensory (2nd Digit)  31.3C  Wrist  *4.9   18.0  Wrist Palm     Palm  *2.4   20.8        Radial Anti Sensory (Base 1st Digit)  31.7C  Wrist  2.3   20.3  Wrist Base 1st Digit  Ulnar Anti Sensory (5th Digit)  31.4C  Wrist  *4.5   *0.3  Wrist 5th Digit  *31    Motor Left/Right Comparison   Stim Site L Lat (ms) R Lat (ms) L-R Lat (ms) L Amp (mV) R Amp (mV) L-R Amp (%) Site1 Site2 L Vel (m/s) R Vel (m/s) L-R Vel (m/s)  Median Motor (Abd Poll Brev)  31.3C  Wrist  *5.5   *1.1  Elbow Wrist  *46   Elbow  10.8   2.2        Ulnar Motor (Abd Dig Min)  31.2C  Wrist  *4.3   3.2  B Elbow Wrist  64   B Elbow  7.9   2.3  A Elbow B Elbow  *44   A Elbow  10.3   2.1           Waveforms:            Clinical History: No specialty comments available.     Objective:  VS:  HT:    WT:   BMI:     BP:   HR: bpm  TEMP: ( )  RESP:  Physical Exam Musculoskeletal:        General: No tenderness.     Comments: Inspection reveals significant atrophy of the right FDI and some atrophy of the right APB.  There seems to be some wasting of the hand intrinsics on the right.  Normal muscle bulk and tone on the left.. There is no swelling, color changes, allodynia or dystrophic changes.  He has good strength in the bilateral APB but significant 3 out of 5 weakness with finger abduction on the right.  He has good long finger flexion.  There is decreased  sensation and mostly in ulnar nerve distribution on the right.  There is a positive Froment's test right. There is a negative Hoffmann's test bilaterally.  Skin:    General: Skin is warm and dry.     Findings: No erythema or rash.  Neurological:     General: No focal deficit present.     Mental Status: He is alert and oriented to person, place, and time.     Sensory: No sensory deficit.     Motor: No weakness or abnormal muscle tone.     Coordination: Coordination normal.     Gait: Gait normal.  Psychiatric:        Mood and Affect: Mood normal.        Behavior: Behavior normal.        Thought Content: Thought content normal.     Imaging: No results found.

## 2021-02-24 ENCOUNTER — Ambulatory Visit: Payer: BC Managed Care – PPO | Admitting: Orthopedic Surgery

## 2021-03-01 ENCOUNTER — Other Ambulatory Visit: Payer: Self-pay | Admitting: Family Medicine

## 2021-03-02 ENCOUNTER — Ambulatory Visit: Payer: BC Managed Care – PPO | Admitting: Orthopedic Surgery

## 2021-03-02 ENCOUNTER — Encounter: Payer: Self-pay | Admitting: Orthopedic Surgery

## 2021-03-02 ENCOUNTER — Other Ambulatory Visit: Payer: Self-pay

## 2021-03-02 VITALS — BP 137/81 | HR 73 | Ht 68.5 in | Wt 243.0 lb

## 2021-03-02 DIAGNOSIS — G5601 Carpal tunnel syndrome, right upper limb: Secondary | ICD-10-CM | POA: Diagnosis not present

## 2021-03-02 DIAGNOSIS — G5621 Lesion of ulnar nerve, right upper limb: Secondary | ICD-10-CM

## 2021-03-02 NOTE — Progress Notes (Signed)
Office Visit Note   Patient: Paul Lynn           Date of Birth: Feb 01, 1966           MRN: [id number] Visit Date: 03/02/2021              Requested by: [name], MD [id number] [facility] [address] [place],  [place] [address] PCP: [name], MD   Assessment & Plan: Visit Diagnoses:  1. Ulnar neuropathy of right upper extremity   2. Carpal tunnel syndrome, right upper limb     Plan: We discussed the diagnosis, prognosis, and both conservative and operative treatment options for his severe right carpal and cubital tunnel syndrome with intrinsic and thenar atrophy.  We reviewed his EMG/NCS which confirmed that he has severe CuTS and CTS.   After our discussion, the patient has elected to proceed with right carpal and cubital tunnel releases.  We reviewed the benefits of surgery and the potential risks including, but not limited to, persistent symptoms, infection, damage to nearby nerves and blood vessels, delayed wound healing.  We also discussed the likelihood that he may not recover intrinsic and thenar muscle strength and bulk given the profound loss and duration of nerve compression.     All patient concerns and questions were addressed.  A surgical date will be confirmed with the patient.    Follow-Up Instructions: No follow-ups on file.   Orders:  No orders of the defined types were placed in this encounter.  No orders of the defined types were placed in this encounter.     Procedures: No procedures performed   Clinical Data: No additional findings.   Subjective: Chief Complaint  Patient presents with   Left Wrist - Follow-up    This is a 55 yo RHD M who presents for follow up of R hand weakness and to discuss EMG/NCS results.  He works as a Psychologist, occupational and noticed that his grip has gotten weaker on the R side for at least the last year.  EMG/NCS results demonstrate severe R cubital and carpal tunnel syndrome with EMG findings of demyelination and electrical  instability.    Review of Systems   Objective: Vital Signs: BP 137/81 (BP Location: Right Arm, Patient Position: Sitting, Cuff Size: Normal)   Pulse 73   Ht 5' 8.5" (1.74 m)   Wt 243 lb (110.2 kg)   SpO2 95%   BMI 36.41 kg/m   Physical Exam Constitutional:      Appearance: Normal appearance.  Cardiovascular:     Rate and Rhythm: Normal rate.     Pulses: Normal pulses.  Pulmonary:     Effort: Pulmonary effort is normal.  Skin:    General: Skin is warm and dry.     Capillary Refill: Capillary refill takes less than 2 seconds.  Neurological:     Mental Status: He is alert.    Right Hand Exam   Tenderness  The patient is experiencing no tenderness.   Range of Motion  The patient has normal right wrist ROM.   Other  Sensation: decreased Pulse: present  Comments:  Profound intrinsic atrophy with minimal active FDI motor function. He has thenar muscle atrophy w/ 4/5 thumb palmar abduction compared to the other side. He has a strongly positive Tinel at the elbow without evidence of nerve subluxation or instability.      Specialty Comments:  No specialty comments available.  Imaging: No results found.   PMFS History: Patient  Active Problem List   Diagnosis Date Noted   Carpal tunnel syndrome, right upper limb 03/02/2021   Ulnar neuropathy of right upper extremity 01/20/2021   Gout    Past Medical History:  Diagnosis Date   Gout    Hypertension     Family History  Problem Relation Age of Onset   Cancer Father        prostate   Prostate cancer Father    Cancer Paternal Uncle        prostate   Prostate cancer Paternal Uncle    Colon cancer Neg Hx    Colon polyps Neg Hx    Esophageal cancer Neg Hx    Rectal cancer Neg Hx    Stomach cancer Neg Hx     Past Surgical History:  Procedure Laterality Date   WISDOM TOOTH EXTRACTION     Social History   Occupational History   Not on file  Tobacco Use   Smoking status: Never   Smokeless tobacco:  Never  Vaping Use   Vaping Use: Never used  Substance and Sexual Activity   Alcohol use: Yes    Comment: occasional   Drug use: No   Sexual activity: Yes

## 2021-03-13 ENCOUNTER — Telehealth: Payer: Self-pay | Admitting: Orthopedic Surgery

## 2021-03-13 NOTE — Telephone Encounter (Signed)
Pt called stating it's been two weeks since he was supposed to get scheduled for surgery and he hasn't heard anything. Pt would like  CB with an estimate of how long he may be waiting.   [phone]

## 2021-03-17 ENCOUNTER — Telehealth: Payer: Self-pay | Admitting: Orthopedic Surgery

## 2021-03-17 NOTE — Telephone Encounter (Signed)
Yep, he can have such note

## 2021-03-17 NOTE — Telephone Encounter (Signed)
Paul Lynn is scheduled for carpal tunnel and cubital tunnel release on 04/05/21.  He would like Dr. Frazier Butt to write him a note stating that he needs to be out of work x 3 weeks after surgery.  Please call patient when the note is ready and he will come by the office to pick it up.  Thank you!

## 2021-03-20 ENCOUNTER — Encounter: Payer: Self-pay | Admitting: Radiology

## 2021-03-20 NOTE — Telephone Encounter (Signed)
I called and advised that his note would be ready downstairs

## 2021-03-28 NOTE — Pre-Procedure Instructions (Signed)
Surgical Instructions    Your procedure is scheduled on Wednesday, December.  Report to [facility] Main Entrance "A" at 12:45 P.M., then check in with the Admitting office.  Call this number if you have problems the morning of surgery:  [phone]   If you have any questions prior to your surgery date call [phone]: Open Monday-Friday 8am-4pm    Remember:  Do not eat after midnight the night before your surgery  You may drink clear liquids until 11:45 a.m. the morning of your surgery.   Clear liquids allowed are: Water, Non-Citrus Juices (without pulp), Carbonated Beverages, Clear Tea, Black Coffee ONLY (NO MILK, CREAM OR POWDERED CREAMER of any kind), and Gatorade    Take these medicines the morning of surgery with A SIP OF WATER  allopurinol (ZYLOPRIM)  As of today, STOP taking any Aspirin (unless otherwise instructed by your surgeon) Aleve, Naproxen, Ibuprofen, Motrin, Advil, Goody's, BC's, all herbal medications, fish oil, and all vitamins.    After your COVID test   You are not required to quarantine however you are required to wear a well-fitting mask when you are out and around people not in your household.  If your mask becomes wet or soiled, replace with a new one.  Wash your hands often with soap and water for 20 seconds or clean your hands with an alcohol-based hand sanitizer that contains at least 60% alcohol.  Do not share personal items.  Notify your provider: if you are in close contact with someone who has COVID  or if you develop a fever of 100.4 or greater, sneezing, cough, sore throat, shortness of breath or body aches.             Do not wear jewelry. Do not wear lotions, powders, colognes, or deodorant. Men may shave face and neck. Do not bring valuables to the hospital.             [facility] is not responsible for any belongings or valuables.  Do NOT Smoke (Tobacco/Vaping)  24 hours prior to your procedure  If you use a CPAP at night, you  may bring your mask for your overnight stay.   Contacts, glasses, hearing aids, dentures or partials may not be worn into surgery, please bring cases for these belongings   For patients admitted to the hospital, discharge time will be determined by your treatment team.   Patients discharged the day of surgery will not be allowed to drive home, and someone needs to stay with them for 24 hours.  NO VISITORS WILL BE ALLOWED IN PRE-OP WHERE PATIENTS ARE PREPPED FOR SURGERY.  ONLY 1 SUPPORT PERSON MAY BE PRESENT IN THE WAITING ROOM WHILE YOU ARE IN SURGERY.  IF YOU ARE TO BE ADMITTED, ONCE YOU ARE IN YOUR ROOM YOU WILL BE ALLOWED TWO (2) VISITORS. 1 (ONE) VISITOR MAY STAY OVERNIGHT BUT MUST ARRIVE TO THE ROOM BY 8pm.  Minor children may have two parents present. Special consideration for safety and communication needs will be reviewed on a case by case basis.  Special instructions:    Oral Hygiene is also important to reduce your risk of infection.  Remember - BRUSH YOUR TEETH THE MORNING OF SURGERY WITH YOUR REGULAR TOOTHPASTE   [facility]- Preparing For Surgery  Before surgery, you can play an important role. Because skin is not sterile, your skin needs to be as free of germs as possible. You can reduce the number of germs on your skin by washing with  CHG (chlorahexidine gluconate) Soap before surgery.  CHG is an antiseptic cleaner which kills germs and bonds with the skin to continue killing germs even after washing.     Please do not use if you have an allergy to CHG or antibacterial soaps. If your skin becomes reddened/irritated stop using the CHG.  Do not shave (including legs and underarms) for at least 48 hours prior to first CHG shower. It is OK to shave your face.  Please follow these instructions carefully.     Shower the NIGHT BEFORE SURGERY and the MORNING OF SURGERY with CHG Soap.   If you chose to wash your hair, wash your hair first as usual with your normal shampoo. After you  shampoo, rinse your hair and body thoroughly to remove the shampoo.  Then [employer] and genitals (private parts) with your normal soap and rinse thoroughly to remove soap.  After that Use CHG Soap as you would any other liquid soap. You can apply CHG directly to the skin and wash gently with a scrungie or a clean washcloth.   Apply the CHG Soap to your body ONLY FROM THE NECK DOWN.  Do not use on open wounds or open sores. Avoid contact with your eyes, ears, mouth and genitals (private parts). Wash Face and genitals (private parts)  with your normal soap.   Wash thoroughly, paying special attention to the area where your surgery will be performed.  Thoroughly rinse your body with warm water from the neck down.  DO NOT shower/wash with your normal soap after using and rinsing off the CHG Soap.  Pat yourself dry with a CLEAN TOWEL.  Wear CLEAN PAJAMAS to bed the night before surgery  Place CLEAN SHEETS on your bed the night before your surgery  DO NOT SLEEP WITH PETS.   Day of Surgery:  Take a shower with CHG soap. Wear Clean/Comfortable clothing the morning of surgery Do not apply any deodorants/lotions.   Remember to brush your teeth WITH YOUR REGULAR TOOTHPASTE.   Please read over the following fact sheets that you were given.

## 2021-03-29 ENCOUNTER — Encounter (HOSPITAL_COMMUNITY)
Admission: RE | Admit: 2021-03-29 | Discharge: 2021-03-29 | Disposition: A | Discharge status: Home / Self Care | Payer: BC Managed Care – PPO | Source: Ambulatory Visit | Attending: Orthopedic Surgery | Admitting: Orthopedic Surgery

## 2021-03-29 ENCOUNTER — Other Ambulatory Visit: Payer: Self-pay

## 2021-03-29 ENCOUNTER — Encounter (HOSPITAL_COMMUNITY): Payer: Self-pay

## 2021-03-29 VITALS — BP 147/89 | HR 64 | Temp 98.5°F | Resp 17 | Ht 68.0 in | Wt 239.0 lb

## 2021-03-29 DIAGNOSIS — Z01818 Encounter for other preprocedural examination: Secondary | ICD-10-CM | POA: Insufficient documentation

## 2021-03-29 LAB — BASIC METABOLIC PANEL
Anion gap: 9 (ref 5–15)
BUN: 22 mg/dL — ABNORMAL HIGH (ref 6–20)
CO2: 23 mmol/L (ref 22–32)
Calcium: 9.4 mg/dL (ref 8.9–10.3)
Chloride: 105 mmol/L (ref 98–111)
Creatinine, Ser: 0.96 mg/dL (ref 0.61–1.24)
GFR, Estimated: >60 mL/min (ref >60)
Glucose, Bld: 105 mg/dL — ABNORMAL HIGH (ref 70–99)
Potassium: 4.2 mmol/L (ref 3.5–5.1)
Sodium: 137 mmol/L (ref 135–145)

## 2021-03-29 LAB — CBC
HCT: 46.6 % (ref 39.0–52.0)
Hemoglobin: 15.8 g/dL (ref 13.0–17.0)
MCH: 32 pg (ref 26.0–34.0)
MCHC: 33.9 g/dL (ref 30.0–36.0)
MCV: 94.5 fL (ref 80.0–100.0)
Platelets: 145 10*3/uL — ABNORMAL LOW (ref 150–400)
RBC: 4.93 MIL/uL (ref 4.22–5.81)
RDW: 12.3 % (ref 11.5–15.5)
WBC: 6.7 10*3/uL (ref 4.0–10.5)
nRBC: 0 % (ref 0.0–0.2)

## 2021-03-29 NOTE — Progress Notes (Signed)
HFW:YOVZCH Pickard, MD Cardiologist: denies  EKG: 03/29/21 CXR: na ECHO: denies Stress Test: denies Cardiac Cath:denies  Fasting Blood Sugar- na Checks Blood Sugar__na_ times a day  OSA/CPAP: No  ASA/ Blood Thinner: No  Covid test not needed - Ambulatory  Anesthesia Review: No  Patient denies shortness of breath, fever, cough, and chest pain at PAT appointment.  Patient verbalized understanding of instructions provided today at the PAT appointment.  Patient asked to review instructions at home and day of surgery.

## 2021-03-29 NOTE — Pre-Procedure Instructions (Addendum)
Surgical Instructions    Your procedure is scheduled on Wednesday, December 7.  Report to [facility] Main Entrance "A" at 12:45 P.M., then check in with the Admitting office.  Call this number if you have problems the morning of surgery:  [phone]   If you have any questions prior to your surgery date call [phone]: Open Monday-Friday 8am-4pm    Remember:  Do not eat after midnight the night before your surgery  You may drink clear liquids until 11:45 a.m. the morning of your surgery.   Clear liquids allowed are: Water, Non-Citrus Juices (without pulp), Carbonated Beverages, Clear Tea, Black Coffee ONLY (NO MILK, CREAM OR POWDERED CREAMER of any kind), and Gatorade  Please complete your PRE-SURGERY ENSURE that was provided to you by 11:45 the morning of surgery.  Please, if able, drink it in one setting. DO NOT SIP.     Take these medicines the morning of surgery with A SIP OF WATER  allopurinol (ZYLOPRIM)  As of today, STOP taking any Aspirin (unless otherwise instructed by your surgeon) Aleve, Naproxen, Ibuprofen, Motrin, Advil, Goody's, BC's, all herbal medications, fish oil, and all vitamins.           Do not wear jewelry. Do not wear lotions, powders, colognes, or deodorant. Men may shave face and neck. Do not bring valuables to the hospital.             [facility] is not responsible for any belongings or valuables.  Do NOT Smoke (Tobacco/Vaping)  24 hours prior to your procedure  If you use a CPAP at night, you may bring your mask for your overnight stay.   Contacts, glasses, hearing aids, dentures or partials may not be worn into surgery, please bring cases for these belongings   For patients admitted to the hospital, discharge time will be determined by your treatment team.   Patients discharged the day of surgery will not be allowed to drive home, and someone needs to stay with them for 24 hours.  NO VISITORS WILL BE ALLOWED IN PRE-OP WHERE PATIENTS ARE  PREPPED FOR SURGERY.  ONLY 1 SUPPORT PERSON MAY BE PRESENT IN THE WAITING ROOM WHILE YOU ARE IN SURGERY.  IF YOU ARE TO BE ADMITTED, ONCE YOU ARE IN YOUR ROOM YOU WILL BE ALLOWED TWO (2) VISITORS. 1 (ONE) VISITOR MAY STAY OVERNIGHT BUT MUST ARRIVE TO THE ROOM BY 8pm.  Minor children may have two parents present. Special consideration for safety and communication needs will be reviewed on a case by case basis.  Special instructions:    Oral Hygiene is also important to reduce your risk of infection.  Remember - BRUSH YOUR TEETH THE MORNING OF SURGERY WITH YOUR REGULAR TOOTHPASTE   [place]- Preparing For Surgery  Before surgery, you can play an important role. Because skin is not sterile, your skin needs to be as free of germs as possible. You can reduce the number of germs on your skin by washing with CHG (chlorahexidine gluconate) Soap before surgery.  CHG is an antiseptic cleaner which kills germs and bonds with the skin to continue killing germs even after washing.     Please do not use if you have an allergy to CHG or antibacterial soaps. If your skin becomes reddened/irritated stop using the CHG.  Do not shave (including legs and underarms) for at least 48 hours prior to first CHG shower. It is OK to shave your face.  Please follow these instructions carefully.  Shower the NIGHT BEFORE SURGERY and the MORNING OF SURGERY with CHG Soap.   If you chose to wash your hair, wash your hair first as usual with your normal shampoo. After you shampoo, rinse your hair and body thoroughly to remove the shampoo.  Then [employer] and genitals (private parts) with your normal soap and rinse thoroughly to remove soap.  After that Use CHG Soap as you would any other liquid soap. You can apply CHG directly to the skin and wash gently with a scrungie or a clean washcloth.   Apply the CHG Soap to your body ONLY FROM THE NECK DOWN.  Do not use on open wounds or open sores. Avoid contact with your eyes,  ears, mouth and genitals (private parts). Wash Face and genitals (private parts)  with your normal soap.   Wash thoroughly, paying special attention to the area where your surgery will be performed.  Thoroughly rinse your body with warm water from the neck down.  DO NOT shower/wash with your normal soap after using and rinsing off the CHG Soap.  Pat yourself dry with a CLEAN TOWEL.  Wear CLEAN PAJAMAS to bed the night before surgery  Place CLEAN SHEETS on your bed the night before your surgery  DO NOT SLEEP WITH PETS.   Day of Surgery:  Take a shower with CHG soap. Wear Clean/Comfortable clothing the morning of surgery Do not apply any deodorants/lotions.   Remember to brush your teeth WITH YOUR REGULAR TOOTHPASTE.   Please read over the following fact sheets that you were given.

## 2021-03-31 ENCOUNTER — Other Ambulatory Visit: Payer: Self-pay | Admitting: Family Medicine

## 2021-04-05 ENCOUNTER — Ambulatory Visit (HOSPITAL_COMMUNITY): Payer: BC Managed Care – PPO | Admitting: Certified Registered Nurse Anesthetist

## 2021-04-05 ENCOUNTER — Encounter (HOSPITAL_COMMUNITY): Payer: Self-pay | Admitting: Orthopedic Surgery

## 2021-04-05 ENCOUNTER — Encounter (HOSPITAL_COMMUNITY)
Admission: RE | Disposition: A | Discharge status: Home / Self Care | Payer: Self-pay | Source: Ambulatory Visit | Attending: Orthopedic Surgery

## 2021-04-05 ENCOUNTER — Ambulatory Visit (HOSPITAL_COMMUNITY)
Admission: RE | Admit: 2021-04-05 | Discharge: 2021-04-05 | Disposition: A | Discharge status: Home / Self Care | Payer: BC Managed Care – PPO | Source: Ambulatory Visit | Attending: Orthopedic Surgery | Admitting: Orthopedic Surgery

## 2021-04-05 ENCOUNTER — Telehealth: Payer: Self-pay | Admitting: Orthopedic Surgery

## 2021-04-05 ENCOUNTER — Other Ambulatory Visit: Payer: Self-pay

## 2021-04-05 DIAGNOSIS — I10 Essential (primary) hypertension: Secondary | ICD-10-CM | POA: Diagnosis not present

## 2021-04-05 DIAGNOSIS — G5601 Carpal tunnel syndrome, right upper limb: Secondary | ICD-10-CM | POA: Diagnosis not present

## 2021-04-05 DIAGNOSIS — Z79899 Other long term (current) drug therapy: Secondary | ICD-10-CM | POA: Insufficient documentation

## 2021-04-05 DIAGNOSIS — G5621 Lesion of ulnar nerve, right upper limb: Secondary | ICD-10-CM | POA: Insufficient documentation

## 2021-04-05 HISTORY — PX: CARPAL TUNNEL RELEASE: SHX101

## 2021-04-05 HISTORY — PX: ULNAR TUNNEL RELEASE: SHX820

## 2021-04-05 SURGERY — CARPAL TUNNEL RELEASE
Anesthesia: General | Site: Arm Lower | Laterality: Right

## 2021-04-05 MED ORDER — KETOROLAC TROMETHAMINE 30 MG/ML IJ SOLN
INTRAMUSCULAR | Status: AC
  Filled 2021-04-05: qty 1

## 2021-04-05 MED ORDER — OXYCODONE HCL 5 MG/5ML PO SOLN: 5.0000 mg | Freq: Once | ORAL | Status: DC | PRN

## 2021-04-05 MED ORDER — KETOROLAC TROMETHAMINE 30 MG/ML IJ SOLN
30.0000 mg | Freq: Once | INTRAMUSCULAR | Status: AC | PRN
  Administered 2021-04-05: 30 mg via INTRAVENOUS

## 2021-04-05 MED ORDER — MIDAZOLAM HCL 5 MG/5ML IJ SOLN
INTRAMUSCULAR | Status: DC | PRN
  Administered 2021-04-05: 2 mg via INTRAVENOUS

## 2021-04-05 MED ORDER — LIDOCAINE 2% (20 MG/ML) 5 ML SYRINGE
INTRAMUSCULAR | Status: AC
  Filled 2021-04-05: qty 5

## 2021-04-05 MED ORDER — 0.9 % SODIUM CHLORIDE (POUR BTL) OPTIME
TOPICAL | Status: DC | PRN
  Administered 2021-04-05: 1000 mL

## 2021-04-05 MED ORDER — LACTATED RINGERS IV SOLN: INTRAVENOUS | Status: DC

## 2021-04-05 MED ORDER — CEFAZOLIN SODIUM-DEXTROSE 2-4 GM/100ML-% IV SOLN
2.0000 g | INTRAVENOUS | Status: AC
  Administered 2021-04-05: 2 g via INTRAVENOUS
  Filled 2021-04-05: qty 100

## 2021-04-05 MED ORDER — PROPOFOL 10 MG/ML IV BOLUS
INTRAVENOUS | Status: AC
  Filled 2021-04-05: qty 20

## 2021-04-05 MED ORDER — ONDANSETRON HCL 4 MG/2ML IJ SOLN
INTRAMUSCULAR | Status: DC | PRN
  Administered 2021-04-05: 4 mg via INTRAVENOUS

## 2021-04-05 MED ORDER — PROPOFOL 10 MG/ML IV BOLUS
INTRAVENOUS | Status: DC | PRN
  Administered 2021-04-05: 200 mg via INTRAVENOUS

## 2021-04-05 MED ORDER — OXYCODONE HCL 5 MG PO TABS: 5.0000 mg | ORAL_TABLET | Freq: Once | ORAL | Status: DC | PRN

## 2021-04-05 MED ORDER — ORAL CARE MOUTH RINSE: 15.0000 mL | Freq: Once | OROMUCOSAL | Status: AC

## 2021-04-05 MED ORDER — CHLORHEXIDINE GLUCONATE 0.12 % MT SOLN
15.0000 mL | Freq: Once | OROMUCOSAL | Status: AC
  Administered 2021-04-05: 15 mL via OROMUCOSAL
  Filled 2021-04-05: qty 15

## 2021-04-05 MED ORDER — FENTANYL CITRATE (PF) 100 MCG/2ML IJ SOLN
25.0000 ug | INTRAMUSCULAR | Status: DC | PRN
  Administered 2021-04-05: 50 ug via INTRAVENOUS

## 2021-04-05 MED ORDER — DEXAMETHASONE SODIUM PHOSPHATE 10 MG/ML IJ SOLN
INTRAMUSCULAR | Status: AC
  Filled 2021-04-05: qty 1

## 2021-04-05 MED ORDER — MIDAZOLAM HCL 2 MG/2ML IJ SOLN
INTRAMUSCULAR | Status: AC
  Filled 2021-04-05: qty 2

## 2021-04-05 MED ORDER — FENTANYL CITRATE (PF) 100 MCG/2ML IJ SOLN
INTRAMUSCULAR | Status: AC
  Filled 2021-04-05: qty 2

## 2021-04-05 MED ORDER — BUPIVACAINE HCL (PF) 0.25 % IJ SOLN
INTRAMUSCULAR | Status: AC
  Filled 2021-04-05: qty 30

## 2021-04-05 MED ORDER — ARTIFICIAL TEARS OPHTHALMIC OINT
TOPICAL_OINTMENT | OPHTHALMIC | Status: AC
  Filled 2021-04-05: qty 3.5

## 2021-04-05 MED ORDER — BUPIVACAINE HCL (PF) 0.25 % IJ SOLN
INTRAMUSCULAR | Status: DC | PRN
  Administered 2021-04-05: 6.5 mL

## 2021-04-05 MED ORDER — OXYCODONE HCL 5 MG PO TABS: 5.0000 mg | ORAL_TABLET | ORAL | 0 refills | Status: AC | PRN

## 2021-04-05 MED ORDER — FENTANYL CITRATE (PF) 250 MCG/5ML IJ SOLN
INTRAMUSCULAR | Status: AC
  Filled 2021-04-05: qty 5

## 2021-04-05 MED ORDER — PROMETHAZINE HCL 25 MG/ML IJ SOLN: 6.2500 mg | INTRAMUSCULAR | Status: DC | PRN

## 2021-04-05 MED ORDER — DEXAMETHASONE SODIUM PHOSPHATE 4 MG/ML IJ SOLN
INTRAMUSCULAR | Status: DC | PRN
  Administered 2021-04-05: 5 mg via INTRAVENOUS

## 2021-04-05 MED ORDER — LIDOCAINE 2% (20 MG/ML) 5 ML SYRINGE
INTRAMUSCULAR | Status: DC | PRN
  Administered 2021-04-05: 100 mg via INTRAVENOUS

## 2021-04-05 MED ORDER — PHENYLEPHRINE HCL-NACL 20-0.9 MG/250ML-% IV SOLN
INTRAVENOUS | Status: DC | PRN
  Administered 2021-04-05: 25 ug/min via INTRAVENOUS

## 2021-04-05 MED ORDER — FENTANYL CITRATE (PF) 100 MCG/2ML IJ SOLN
INTRAMUSCULAR | Status: DC | PRN
  Administered 2021-04-05: 50 ug via INTRAVENOUS
  Administered 2021-04-05: 100 ug via INTRAVENOUS
  Administered 2021-04-05 (×2): 50 ug via INTRAVENOUS

## 2021-04-05 MED ORDER — ONDANSETRON HCL 4 MG/2ML IJ SOLN
INTRAMUSCULAR | Status: AC
  Filled 2021-04-05: qty 2

## 2021-04-05 SURGICAL SUPPLY — 32 items
BAG COUNTER SPONGE SURGICOUNT (BAG) ×2 IMPLANT
BNDG ELASTIC 4X5.8 VLCR STR LF (GAUZE/BANDAGES/DRESSINGS) ×4 IMPLANT
BNDG GAUZE ELAST 4 BULKY (GAUZE/BANDAGES/DRESSINGS) ×2 IMPLANT
CORD BIPOLAR FORCEPS 12FT (ELECTRODE) ×2 IMPLANT
COVER SURGICAL LIGHT HANDLE (MISCELLANEOUS) ×2 IMPLANT
CUFF TOURN SGL QUICK 18X4 (TOURNIQUET CUFF) ×2 IMPLANT
CUFF TOURN SGL QUICK 24 (TOURNIQUET CUFF)
CUFF TRNQT CYL 24X4X16.5-23 (TOURNIQUET CUFF) IMPLANT
DRAPE U-SHAPE 47X51 STRL (DRAPES) ×2 IMPLANT
DRSG XEROFORM 1X8 (GAUZE/BANDAGES/DRESSINGS) ×2 IMPLANT
GAUZE SPONGE 4X4 12PLY STRL (GAUZE/BANDAGES/DRESSINGS) ×2 IMPLANT
GLOVE SURG ENC TEXT LTX SZ7 (GLOVE) ×2 IMPLANT
GOWN STRL REUS W/ TWL LRG LVL3 (GOWN DISPOSABLE) ×2 IMPLANT
GOWN STRL REUS W/TWL LRG LVL3 (GOWN DISPOSABLE) ×2
KIT BASIN OR (CUSTOM PROCEDURE TRAY) ×2 IMPLANT
KIT TURNOVER KIT B (KITS) ×2 IMPLANT
LOOP VESSEL MAXI BLUE (MISCELLANEOUS) IMPLANT
NEEDLE HYPO 25GX1X1/2 BEV (NEEDLE) ×2 IMPLANT
NS IRRIG 1000ML POUR BTL (IV SOLUTION) ×2 IMPLANT
PACK ORTHO EXTREMITY (CUSTOM PROCEDURE TRAY) ×2 IMPLANT
PAD ARMBOARD 7.5X6 YLW CONV (MISCELLANEOUS) ×4 IMPLANT
PAD CAST 4YDX4 CTTN HI CHSV (CAST SUPPLIES) ×1 IMPLANT
PADDING CAST COTTON 4X4 STRL (CAST SUPPLIES) ×1
SUT ETHIBOND 3-0 V-5 (SUTURE) IMPLANT
SUT ETHILON 4 0 PS 2 18 (SUTURE) ×4 IMPLANT
SUT MNCRL AB 3-0 PS2 18 (SUTURE) ×2 IMPLANT
SYR CONTROL 10ML LL (SYRINGE) ×2 IMPLANT
TOWEL GREEN STERILE (TOWEL DISPOSABLE) ×2 IMPLANT
TOWEL GREEN STERILE FF (TOWEL DISPOSABLE) ×2 IMPLANT
TUBE CONNECTING 12X1/4 (SUCTIONS) ×2 IMPLANT
UNDERPAD 30X36 HEAVY ABSORB (UNDERPADS AND DIAPERS) ×2 IMPLANT
WATER STERILE IRR 1000ML POUR (IV SOLUTION) IMPLANT

## 2021-04-05 NOTE — H&P (Signed)
HPI: Paul Lynn is a 55 y.o. male who presents with severe right carpal and cubital tunnel syndromes with significant atrophy of the first dorsal interossei and grip weakness.  He was seen in my office just over a month ago. EMG/NCS results demonstrate severe R cubital and carpal tunnel syndrome with EMG findings of demyelination and electrical instability. He presents today for surgery.  He denies fevers, chills, nausea, vomiting, chest pain, shortness of breath, or any other new systemic symptoms.   Hand dominance: RHD Occupation: Psychologist, occupational  Past Medical History:  Diagnosis Date   Gout    Hypertension    Past Surgical History:  Procedure Laterality Date   WISDOM TOOTH EXTRACTION     Social History   Socioeconomic History   Marital status: Married    Spouse name: Not on file   Number of children: Not on file   Years of education: Not on file   Highest education level: Not on file  Occupational History   Not on file  Tobacco Use   Smoking status: Never   Smokeless tobacco: Never  Vaping Use   Vaping Use: Never used  Substance and Sexual Activity   Alcohol use: Yes    Comment: occasional   Drug use: No   Sexual activity: Yes  Other Topics Concern   Not on file  Social History Narrative   Not on file   Social Determinants of Health   Financial Resource Strain: Not on file  Food Insecurity: Not on file  Transportation Needs: Not on file  Physical Activity: Not on file  Stress: Not on file  Social Connections: Not on file   Family History  Problem Relation Age of Onset   Cancer Father        prostate   Prostate cancer Father    Cancer Paternal Uncle        prostate   Prostate cancer Paternal Uncle    Colon cancer Neg Hx    Colon polyps Neg Hx    Esophageal cancer Neg Hx    Rectal cancer Neg Hx    Stomach cancer Neg Hx    - negative except otherwise stated in the family history section No Known Allergies Prior to Admission medications   Medication  Sig Start Date End Date Taking? Authorizing Provider  lisinopril (ZESTRIL) 20 MG tablet TAKE 1 TABLET BY MOUTH EVERY DAY 01/22/20  Yes [name], MD  allopurinol (ZYLOPRIM) 100 MG tablet TAKE 3 TABLETS BY MOUTH DAILY 03/31/21   [name], MD  indomethacin (INDOCIN) 50 MG capsule Take 1 capsule (50 mg total) by mouth 2 (two) times daily with a meal. Patient not taking: Reported on 03/22/2021 09/28/16   [name], MD   No results found. - pertinent xrays, CT, MRI studies were reviewed and independently interpreted  Positive ROS: All other systems have been reviewed and were otherwise negative with the exception of those mentioned in the HPI and as above.  Physical Exam: General: No acute distress, resting comfortably Cardiovascular: BUE warm and well perfused Respiratory: Normal work of breathing on room air Skin: No lesions in the area of chief complaint Neurologic: Sensation intact distally Psychiatric: Patient is at baseline mood and affect  Right Upper Extremity  Hand warm and well perfused + Tinel at elbow and wrist 4/5 grip strength Significant intrinsic atrophy    Assessment: 55 yo RHD M w/ severe R CTS and CuTS.   Plan: OR today for R carpal  and cubital tunnel decompression Risks, benefits, and alternatives to surgery discussed again.  Informed consent signed DC to home from PACU w/ PO pain medication Will see back in the office in 10-14 days    Marlyne Beards, M.D. [facility] 12:25 PM

## 2021-04-05 NOTE — Anesthesia Postprocedure Evaluation (Signed)
Anesthesia Post Note  Patient: DA AUTHEMENT  Procedure(s) Performed: RIGHT CARPAL TUNNEL RELEASE (Right: Arm Lower) RIGHT CUBITAL TUNNEL RELEASE (Right: Arm Lower)     Patient location during evaluation: PACU Anesthesia Type: General Level of consciousness: awake and alert Pain management: pain level controlled Vital Signs Assessment: post-procedure vital signs reviewed and stable Respiratory status: spontaneous breathing, nonlabored ventilation, respiratory function stable and patient connected to nasal cannula oxygen Cardiovascular status: blood pressure returned to baseline and stable Postop Assessment: no apparent nausea or vomiting Anesthetic complications: no   No notable events documented.  Last Vitals:  Vitals:   04/05/21 1615 04/05/21 1630  BP: 134/71 126/74  Pulse: 65 61  Resp: 15 18  Temp: (!) 36.4 C   SpO2: 95% 96%    Last Pain:  Vitals:   04/05/21 1615  TempSrc:   PainSc: 4                  Alison Breeding S

## 2021-04-05 NOTE — Anesthesia Procedure Notes (Signed)
Procedure Name: LMA Insertion Date/Time: 04/05/2021 2:53 PM Performed by: Mayer Camel, CRNA Pre-anesthesia Checklist: Patient identified, Emergency Drugs available, Suction available and Patient being monitored Patient Re-evaluated:Patient Re-evaluated prior to induction Oxygen Delivery Method: Circle System Utilized Preoxygenation: Pre-oxygenation with 100% oxygen Induction Type: IV induction Ventilation: Mask ventilation without difficulty LMA: LMA inserted LMA Size: 4.0 Number of attempts: 1 Airway Equipment and Method: Bite block Placement Confirmation: positive ETCO2 Tube secured with: Tape Dental Injury: Teeth and Oropharynx as per pre-operative assessment

## 2021-04-05 NOTE — Brief Op Note (Signed)
04/05/2021  4:14 PM  PATIENT:  Paul Lynn  55 y.o. male  PRE-OPERATIVE DIAGNOSIS:  Right Cubital Tunnel and Carpal Tunnel Syndrome  POST-OPERATIVE DIAGNOSIS:  Right Cubital Tunnel and Carpal Tunnel Syndrome  PROCEDURE:  Procedure(s): RIGHT CARPAL TUNNEL RELEASE (Right) RIGHT CUBITAL TUNNEL RELEASE (Right)  SURGEON:  Surgeon(s) and Role:    * Marlyne Beards, MD - Primary  PHYSICIAN ASSISTANT:   ASSISTANTS: none   ANESTHESIA:   general  EBL:  5 mL   BLOOD ADMINISTERED:none  DRAINS: none   LOCAL MEDICATIONS USED:  MARCAINE     SPECIMEN:  No Specimen  DISPOSITION OF SPECIMEN:  N/A  COUNTS:  YES  TOURNIQUET:   Total Tourniquet Time Documented: Upper Arm (Right) - 42 minutes Total: Upper Arm (Right) - 42 minutes   DICTATION: .Reubin Milan Dictation  PLAN OF CARE: Discharge to home after PACU  PATIENT DISPOSITION:  PACU - hemodynamically stable.   Delay start of Pharmacological VTE agent (>24hrs) due to surgical blood loss or risk of bleeding: not applicable

## 2021-04-05 NOTE — Anesthesia Preprocedure Evaluation (Signed)
Anesthesia Evaluation  Patient identified by MRN, date of birth, ID band Patient awake    Reviewed: Allergy & Precautions, H&P , NPO status , Patient's Chart, lab work & pertinent test results  Airway Mallampati: II  TM Distance: >3 FB Neck ROM: Full    Dental no notable dental hx.    Pulmonary neg pulmonary ROS,    Pulmonary exam normal breath sounds clear to auscultation       Cardiovascular hypertension, Pt. on medications Normal cardiovascular exam Rhythm:Regular Rate:Normal     Neuro/Psych negative neurological ROS  negative psych ROS   GI/Hepatic negative GI ROS, Neg liver ROS,   Endo/Other  negative endocrine ROS  Renal/GU negative Renal ROS  negative genitourinary   Musculoskeletal negative musculoskeletal ROS (+)   Abdominal   Peds negative pediatric ROS (+)  Hematology negative hematology ROS (+)   Anesthesia Other Findings   Reproductive/Obstetrics negative OB ROS                             Anesthesia Physical Anesthesia Plan  ASA: 2  Anesthesia Plan: General   Post-op Pain Management:    Induction: Intravenous  PONV Risk Score and Plan: 2 and Ondansetron, Dexamethasone and Treatment may vary due to age or medical condition  Airway Management Planned: LMA  Additional Equipment:   Intra-op Plan:   Post-operative Plan: Extubation in OR  Informed Consent: I have reviewed the patients History and Physical, chart, labs and discussed the procedure including the risks, benefits and alternatives for the proposed anesthesia with the patient or authorized representative who has indicated his/her understanding and acceptance.     Dental advisory given  Plan Discussed with: CRNA and Surgeon  Anesthesia Plan Comments:         Anesthesia Quick Evaluation

## 2021-04-05 NOTE — Transfer of Care (Signed)
Immediate Anesthesia Transfer of Care Note  Patient: Paul Lynn  Procedure(s) Performed: RIGHT CARPAL TUNNEL RELEASE (Right: Arm Lower) RIGHT CUBITAL TUNNEL RELEASE (Right: Arm Lower)  Patient Location: PACU  Anesthesia Type:General  Level of Consciousness: awake, alert  and oriented  Airway & Oxygen Therapy: Patient Spontanous Breathing and Patient connected to face mask oxygen  Post-op Assessment: Report given to RN and Post -op Vital signs reviewed and stable  Post vital signs: Reviewed and stable  Last Vitals:  Vitals Value Taken Time  BP 134/71 04/05/21 1615  Temp    Pulse 66 04/05/21 1618  Resp 12 04/05/21 1618  SpO2 96 % 04/05/21 1618  Vitals shown include unvalidated device data.  Last Pain:  Vitals:   04/05/21 1257  TempSrc:   PainSc: 0-No pain         Complications: No notable events documented.

## 2021-04-05 NOTE — Interval H&P Note (Signed)
History and Physical Interval Note:  04/05/2021 12:28 PM  Paul Lynn  has presented today for surgery, with the diagnosis of Right Cubital Tunnel and Carpal Tunnel Syndrome.  The various methods of treatment have been discussed with the patient and family. After consideration of risks, benefits and other options for treatment, the patient has consented to  Procedure(s): RIGHT CARPAL TUNNEL RELEASE (Right) RIGHT CUBITAL TUNNEL RELEASE (Right) as a surgical intervention.  The patient's history has been reviewed, patient examined, no change in status, stable for surgery.  I have reviewed the patient's chart and labs.  Questions were answered to the patient's satisfaction.     Tien Spooner Jazzlyn Huizenga

## 2021-04-05 NOTE — Telephone Encounter (Signed)
Pt submitted medical release form, Short term disability form, and $25.00 cash payment to [employer]. Accepted 04/05/21

## 2021-04-05 NOTE — Op Note (Signed)
Date of Surgery: 04/05/2021  INDICATIONS: Mr. Zappone is a 55 y.o.-year-old male with right median and ulnar neuropathy with first dorsal interosseous wasting and subjective hand weakness.  An EMG/NCS confirmed median neuropathy at the wrist and ulnar neuropathy at the elbow.  Risks, benefits, and alternatives to surgery were again discussed with the patient wishing to proceed with surgery.  Informed consent was signed after our discussion.   PREOPERATIVE DIAGNOSIS:  Right carpal tunnel syndrome Right cubital tunnel syndrome  POSTOPERATIVE DIAGNOSIS: Same.  PROCEDURE:  Right carpal tunnel release Right cubital tunnel release   SURGEON: Waylan Rocher, M.D.  ASSIST:   ANESTHESIA:  general  IV FLUIDS AND URINE: See anesthesia.  ESTIMATED BLOOD LOSS: 15 mL.  IMPLANTS: * No implants in log *   DRAINS: None  COMPLICATIONS: see description of procedure.  DESCRIPTION OF PROCEDURE: The patient was met in the preoperative holding area where the surgical site was marked and the consent form was verified.  The patient was then taken to the operating room and transferred to the operating table.  All bony prominences were well padded.  General endotracheal anesthesia was induced.  The operative extremity was prepped and draped in the usual and sterile fashion.  A formal time-out was performed to confirm that this was the correct patient, surgery, side, and site. A sterile tourniquet was applied to the right upper extremity.  Following timeout, the limb was exsanguinated with an esmarch bandage.  An incision was made at the ulnar side of the elbow just posterior to the medial epicondyle and extending both proximally and distally.  The skin and subcutaneous tissue was sharply divided.  Blunt dissection was used to identify the ulnar nerve just posterior and proximal to the epicondyle.  Obsborne's ligament was sharply opened.  The nerve appeared to be flattened in this are suggesting significant  compression.  The fascia overlying the FCU muscle belly was incised.  Blunt dissection was used to spread the two heads of the FCU muscle belly in line with their fibers to decompress the nerve distally.  The nerve was further dissected and decompressed proximally.  The nerve was decompressed to the level of the [place] proximally.  There were no additional areas of compression noted.  The nerve was stable in it's bed with flexion and extension of the elbow without evidence of subluxation.  The wound was thoroughly irrigated and covered with a moist sponge.  I then turned my attention to the carpal tunnel release.  A longitudinal incision was made in line with the radial border of the ring finger.  The skin and subcutaneous tissue was divided. There was an anomalous muscle just deep to the subcutaneous tissue sitting on the transverse carpal ligament.  This was bluntly swept to the side.  The transverse carpal ligament was identified deep to this.  The ligament was sharply incised from proximal to distal to the level of the perivascular fat at the level of the palmar arch.  A retractor was placed proximally and the ligament and distal antebrachial fascia released under direct visualization.  Following complete release, the wound was thoroughly irrigated.  The tourniquet was deflated and hemostasis achieved with direct pressure.  The carpal tunnel wound was closed with 4-0 nylon suture.  The elbow incision was closed with 3-0 monocryl in a buried interrupted fashion followed by 4-0 nylon sutures.  The wound was dressed with xeroform, bulky 4x4, and an ace wrap.  He was then extubated uneventfully and transferred to  the postop bed.  All counts were correct x 2 at the end of the procedure.  He was then taken to the PACU in stable condition.   POSTOPERATIVE PLAN: He will be discharged to home with appropriate pain medication and discharge instructions.  He will see me in the office in 10-14 days for  his first postop visit.   Audria Nine, MD 4:23 PM

## 2021-04-05 NOTE — Discharge Instructions (Signed)
Paul Lynn, M.D. Hand Surgery  POST-OPERATIVE DISCHARGE INSTRUCTIONS   PRESCRIPTIONS: You have been given a prescription to be taken as directed for post-operative pain control.  You may also take over the counter ibuprofen/aleve and tylenol for pain. Take this as directed on the packaging. Do not exceed 3000 mg tylenol/acetaminophen in 24 hours.  Ibuprofen 600-800 mg (3-4) tablets by mouth every 6 hours as needed for pain.  OR Aleve 2 tablets by mouth every 12 hours (twice daily) as needed for pain.  AND/OR Tylenol 1000 mg (2 tablets) every 8 hours as needed for pain.  Please use your pain medication carefully, as refills are limited and you may not be provided with one.  As stated above, please use over the counter pain medicine - it will also be helpful with decreasing your swelling.    ANESTHESIA: After your surgery, post-surgical discomfort or pain is likely. This discomfort can last several days to a few weeks. At certain times of the day your discomfort may be more intense.   Did you receive a nerve block?  A nerve block can provide pain relief for one hour to two days after your surgery. As long as the nerve block is working, you will experience little or no sensation in the area the surgeon operated on.  As the nerve block wears off, you will begin to experience pain or discomfort. It is very important that you begin taking your prescribed pain medication before the nerve block fully wears off. Treating your pain at the first sign of the block wearing off will ensure your pain is better controlled and more tolerable when full-sensation returns. Do not wait until the pain is intolerable, as the medicine will be less effective. It is better to treat pain in advance than to try and catch up.   General Anesthesia:  If you did not receive a nerve block during your surgery, you will need to start taking your pain medication shortly after your surgery and should continue to  do so as prescribed by your surgeon.     ICE AND ELEVATION: You may use ice for the first 48-72 hours, but it is not critical.   Motion of your fingers is very important s to decrease the swelling.  Elevation, as much as possible for the next 48 hours, is critical for decreasing swelling as well as for pain relief. Elevation means when you are seated or lying down, you hand should be at or above your heart. When walking, the hand needs to be at or above the level of your elbow.  If the bandage gets too tight, it may need to be loosened. Please contact our office and we will instruct you in how to do this.    SURGICAL BANDAGES:  Keep your dressing and/or splint clean and dry at all times.  You can remove your dressing 7 days from now and change with a dry dressing or Band-Aids as needed thereafter. You may place a plastic bag over your bandage to shower, but be careful, do not get your bandages wet.  After the bandages have been removed, it is OK to get the stitches wet in a shower or with hand washing. Do Not soak or submerge the wound yet. Please do not use lotions or creams on the stitches.      HAND THERAPY:  You may not need any. If you do, we will begin this at your follow up visit in the clinic.  ACTIVITY AND WORK: You are encouraged to move any fingers which are not in the bandage.  Light use of the fingers is allowed to assist the other hand with daily hygiene and eating, but strong gripping or lifting is often uncomfortable and should be avoided.  You might miss a variable period of time from work and hopefully this issue has been discussed prior to surgery. You may not do any heavy work with your affected hand for about 2 weeks.    [facility] [address] [place],  [place]  [address] [phone]

## 2021-04-06 ENCOUNTER — Encounter (HOSPITAL_COMMUNITY): Payer: Self-pay | Admitting: Orthopedic Surgery

## 2021-04-17 ENCOUNTER — Other Ambulatory Visit: Payer: Self-pay

## 2021-04-17 ENCOUNTER — Ambulatory Visit (INDEPENDENT_AMBULATORY_CARE_PROVIDER_SITE_OTHER): Payer: BC Managed Care – PPO | Admitting: Orthopedic Surgery

## 2021-04-17 ENCOUNTER — Encounter: Payer: Self-pay | Admitting: Orthopedic Surgery

## 2021-04-17 DIAGNOSIS — G5601 Carpal tunnel syndrome, right upper limb: Secondary | ICD-10-CM

## 2021-04-17 DIAGNOSIS — G5621 Lesion of ulnar nerve, right upper limb: Secondary | ICD-10-CM

## 2021-04-17 NOTE — Progress Notes (Signed)
° °  Post-Op Visit Note   Patient: Paul Lynn           Date of Birth: 11/10/1965           MRN: [id number] Visit Date: 04/17/2021 PCP: [name], MD   Assessment & Plan:  Chief Complaint:  Chief Complaint  Patient presents with   Right Hand - Routine Post Op   Visit Diagnoses:  1. Cubital tunnel syndrome on right   2. Carpal tunnel syndrome, right upper limb     Plan: Patient doing well postoperatively.  We again discussed the nature of his long standing ulnar denervation and possibility that intrinsic motor function does not improve.  His incisions are clean and dry with no surrounding erythema or induration.  Sutures removed.  He can follow up with me again as needed.   Follow-Up Instructions: No follow-ups on file.   Orders:  No orders of the defined types were placed in this encounter.  No orders of the defined types were placed in this encounter.   Imaging: No results found.  PMFS History: Patient Active Problem List   Diagnosis Date Noted   Carpal tunnel syndrome, right upper limb 03/02/2021   Cubital tunnel syndrome on right 01/20/2021   Gout    Past Medical History:  Diagnosis Date   Gout    Hypertension     Family History  Problem Relation Age of Onset   Cancer Father        prostate   Prostate cancer Father    Cancer Paternal Uncle        prostate   Prostate cancer Paternal Uncle    Colon cancer Neg Hx    Colon polyps Neg Hx    Esophageal cancer Neg Hx    Rectal cancer Neg Hx    Stomach cancer Neg Hx     Past Surgical History:  Procedure Laterality Date   CARPAL TUNNEL RELEASE Right 04/05/2021   Procedure: RIGHT CARPAL TUNNEL RELEASE;  Surgeon: Marlyne Beards, MD;  Location: [facility];  Service: Orthopedics;  Laterality: Right;   ULNAR TUNNEL RELEASE Right 04/05/2021   Procedure: RIGHT CUBITAL TUNNEL RELEASE;  Surgeon: Marlyne Beards, MD;  Location: [facility];  Service: Orthopedics;  Laterality: Right;   WISDOM TOOTH EXTRACTION      Social History   Occupational History   Not on file  Tobacco Use   Smoking status: Never   Smokeless tobacco: Never  Vaping Use   Vaping Use: Never used  Substance and Sexual Activity   Alcohol use: Yes    Comment: occasional   Drug use: No   Sexual activity: Yes

## 2021-04-27 ENCOUNTER — Other Ambulatory Visit: Payer: Self-pay | Admitting: Family Medicine

## 2021-04-27 MED ORDER — ALLOPURINOL 100 MG PO TABS: 300.0000 mg | ORAL_TABLET | Freq: Every day | ORAL | 1 refills | Status: DC

## 2021-04-27 NOTE — Telephone Encounter (Signed)
Allopurinol refill request.  Last seen 12/2020, last filled 03/31/2021.  Directions say take 1 tablet three times a day.

## 2021-05-12 ENCOUNTER — Other Ambulatory Visit: Payer: Self-pay

## 2021-05-12 MED ORDER — LISINOPRIL 20 MG PO TABS: 20.0000 mg | ORAL_TABLET | Freq: Every day | ORAL | 3 refills | Status: DC

## 2021-12-24 ENCOUNTER — Other Ambulatory Visit: Payer: Self-pay | Admitting: Family Medicine

## 2021-12-25 NOTE — Telephone Encounter (Signed)
Requested medication (s) are due for refill today: yes  Requested medication (s) are on the active medication list: yes    Last refill: 04/27/22  #270  1 refill  Future visit scheduled no  Notes to clinic:Failed due to labs, please review. Thank you.  Requested Prescriptions  Pending Prescriptions Disp Refills   allopurinol (ZYLOPRIM) 100 MG tablet [Pharmacy Med Name: ALLOPURINOL 100 MG TABLET] 270 tablet 1    Sig: TAKE 3 TABLETS BY MOUTH DAILY     Endocrinology:  Gout Agents - allopurinol Failed - 12/24/2021  3:15 PM      Failed - Uric Acid in normal range and within 360 days    Uric Acid, Serum  Date Value Ref Range Status  10/12/2013 6.5 4.0 - 7.8 mg/dL Final         Failed - Valid encounter within last 12 months    Recent Outpatient Visits           11 months ago Carpal tunnel syndrome of right wrist   [facility] [name], MD   3 years ago General medical exam   [facility] [name], MD   4 years ago General medical exam   [facility] [name], MD   5 years ago General medical exam   [facility] [name], MD   7 years ago Routine general medical examination at a health care facility   [facility] Pickard, Priscille Heidelberg, MD              Passed - Cr in normal range and within 360 days    Creat  Date Value Ref Range Status  01/10/2021 0.82 0.70 - 1.30 mg/dL Final   Creatinine, Ser  Date Value Ref Range Status  03/29/2021 0.96 0.61 - 1.24 mg/dL Final         Passed - CBC within normal limits and completed in the last 12 months    WBC  Date Value Ref Range Status  03/29/2021 6.7 4.0 - 10.5 K/uL Final   RBC  Date Value Ref Range Status  03/29/2021 4.93 4.22 - 5.81 MIL/uL Final   Hemoglobin  Date Value Ref Range Status  03/29/2021 15.8 13.0 - 17.0 g/dL Final   HCT  Date Value Ref Range Status  03/29/2021 46.6 39.0 - 52.0 % Final   MCHC   Date Value Ref Range Status  03/29/2021 33.9 30.0 - 36.0 g/dL Final   [facility]  Date Value Ref Range Status  03/29/2021 32.0 26.0 - 34.0 pg Final   MCV  Date Value Ref Range Status  03/29/2021 94.5 80.0 - 100.0 fL Final   No results found for: "PLTCOUNTKUC", "LABPLAT", "POCPLA" RDW  Date Value Ref Range Status  03/29/2021 12.3 11.5 - 15.5 % Final

## 2022-07-07 ENCOUNTER — Other Ambulatory Visit: Payer: Self-pay | Admitting: Family Medicine

## 2022-07-30 ENCOUNTER — Encounter: Payer: Self-pay | Admitting: Family Medicine

## 2022-07-30 ENCOUNTER — Ambulatory Visit: Payer: BC Managed Care – PPO | Admitting: Family Medicine

## 2022-07-30 VITALS — BP 122/86 | HR 89 | Temp 99.5°F | Ht 68.0 in | Wt 237.0 lb

## 2022-07-30 DIAGNOSIS — R051 Acute cough: Secondary | ICD-10-CM | POA: Diagnosis not present

## 2022-07-30 DIAGNOSIS — J069 Acute upper respiratory infection, unspecified: Secondary | ICD-10-CM

## 2022-07-30 LAB — INFLUENZA A AND B AG, IMMUNOASSAY
INFLUENZA A ANTIGEN: NOT DETECTED
INFLUENZA B ANTIGEN: NOT DETECTED

## 2022-07-30 MED ORDER — HYDROCODONE BIT-HOMATROP MBR 5-1.5 MG/5ML PO SOLN: 5.0000 mL | Freq: Three times a day (TID) | ORAL | 0 refills | Status: AC | PRN

## 2022-07-30 NOTE — Assessment & Plan Note (Addendum)
Reassured patient that symptoms and exam findings are most consistent with a viral upper respiratory infection and explained lack of efficacy of antibiotics against viruses.  Discussed expected course and features suggestive of secondary bacterial infection.  Continue supportive care. Increase fluid intake with water or electrolyte solution like pedialyte. Encouraged acetaminophen as needed for fever/pain. Encouraged salt water gargling, chloraseptic spray and throat lozenges. Encouraged OTC guaifenesin. Encouraged saline sinus flushes and/or neti with humidified air.  Encouraged to return to office if cough persists to assess for underlying etiology or symptoms worsen.

## 2022-07-30 NOTE — Patient Instructions (Signed)

## 2022-07-30 NOTE — Progress Notes (Signed)
Acute Office Visit  Subjective:     Patient ID: Paul Lynn, male    DOB: 09-07-1965, 57 y.o.   MRN: [id number]  Chief Complaint  Patient presents with   Cough    Pt dry cough x 6 mos worsen in the last week;     Cough   Patient is in today for ongoing cough for several months, he reports it has gotten better then worse again since Thursday. It was worse this past weekend associated with malaise and feeling "crummy", mild wheezing after cough, and post-nasal drip. Cough is constant throughout the day. Denies SOB, fever, body aches, congestion, sinus pressure, rhinorrhea No known sick exposures, no known allergies Has tried OTC medications.  Review of Systems  Respiratory:  Positive for cough.   All other systems reviewed and are negative.   Past Medical History:  Diagnosis Date   Gout    Hypertension    Past Surgical History:  Procedure Laterality Date   CARPAL TUNNEL RELEASE Right 04/05/2021   Procedure: RIGHT CARPAL TUNNEL RELEASE;  Surgeon: Sherilyn Cooter, MD;  Location: [place];  Service: Orthopedics;  Laterality: Right;   ULNAR TUNNEL RELEASE Right 04/05/2021   Procedure: RIGHT CUBITAL TUNNEL RELEASE;  Surgeon: Sherilyn Cooter, MD;  Location: [place];  Service: Orthopedics;  Laterality: Right;   WISDOM TOOTH EXTRACTION     Current Outpatient Medications on File Prior to Visit  Medication Sig Dispense Refill   allopurinol (ZYLOPRIM) 100 MG tablet TAKE 3 TABLETS BY MOUTH DAILY 270 tablet 1   indomethacin (INDOCIN) 50 MG capsule Take 1 capsule (50 mg total) by mouth 2 (two) times daily with a meal. 60 capsule 2   lisinopril (ZESTRIL) 20 MG tablet TAKE 1 TABLET BY MOUTH EVERY DAY 90 tablet 3   No current facility-administered medications on file prior to visit.   No Known Allergies      Objective:    BP 122/86   Pulse 89   Temp 99.5 F (37.5 C)   Ht 5\' 8"  (1.727 m)   Wt 237 lb (107.5 kg)   SpO2 95%   BMI 36.04 kg/m    Physical Exam Vitals and  nursing note reviewed.  Constitutional:      Appearance: Normal appearance. He is normal weight.  HENT:     Head: Normocephalic and atraumatic.  Cardiovascular:     Rate and Rhythm: Normal rate and regular rhythm.     Pulses: Normal pulses.     Heart sounds: Normal heart sounds.  Pulmonary:     Effort: Pulmonary effort is normal.     Breath sounds: Normal breath sounds.  Skin:    General: Skin is warm and dry.     Capillary Refill: Capillary refill takes less than 2 seconds.  Neurological:     General: No focal deficit present.     Mental Status: He is alert and oriented to person, place, and time. Mental status is at baseline.  Psychiatric:        Mood and Affect: Mood normal.        Behavior: Behavior normal.        Thought Content: Thought content normal.        Judgment: Judgment normal.     No results found for any visits on 07/30/22.      Assessment & Plan:   Problem List Items Addressed This Visit       Respiratory   Viral URI with cough - Primary  Reassured patient that symptoms and exam findings are most consistent with a viral upper respiratory infection and explained lack of efficacy of antibiotics against viruses.  Discussed expected course and features suggestive of secondary bacterial infection.  Continue supportive care. Increase fluid intake with water or electrolyte solution like pedialyte. Encouraged acetaminophen as needed for fever/pain. Encouraged salt water gargling, chloraseptic spray and throat lozenges. Encouraged OTC guaifenesin. Encouraged saline sinus flushes and/or neti with humidified air.  Encouraged to return to office if cough persists to assess for underlying etiology or symptoms worsen.      Other Visit Diagnoses     Acute cough       Relevant Medications   HYDROcodone bit-homatropine (HYCODAN) 5-1.5 MG/5ML syrup   Other Relevant Orders   Influenza A and B Ag, Immunoassay       Meds ordered this encounter  Medications    HYDROcodone bit-homatropine (HYCODAN) 5-1.5 MG/5ML syrup    Sig: Take 5 mLs by mouth every 8 (eight) hours as needed for cough.    Dispense:  120 mL    Refill:  0    Order Specific Question:   Supervising Provider    Answer:   Jenna Luo T [id number]    Return if symptoms worsen or fail to improve.  Rubie Maid, FNP

## 2022-08-16 ENCOUNTER — Other Ambulatory Visit: Payer: BC Managed Care – PPO

## 2022-08-16 DIAGNOSIS — Z0000 Encounter for general adult medical examination without abnormal findings: Secondary | ICD-10-CM

## 2022-08-16 DIAGNOSIS — Z13220 Encounter for screening for lipoid disorders: Secondary | ICD-10-CM

## 2022-08-16 DIAGNOSIS — R739 Hyperglycemia, unspecified: Secondary | ICD-10-CM | POA: Diagnosis not present

## 2022-08-16 DIAGNOSIS — I10 Essential (primary) hypertension: Secondary | ICD-10-CM | POA: Diagnosis not present

## 2022-08-16 DIAGNOSIS — Z125 Encounter for screening for malignant neoplasm of prostate: Secondary | ICD-10-CM

## 2022-08-21 ENCOUNTER — Ambulatory Visit (INDEPENDENT_AMBULATORY_CARE_PROVIDER_SITE_OTHER): Payer: BC Managed Care – PPO | Admitting: Family Medicine

## 2022-08-21 ENCOUNTER — Encounter: Payer: Self-pay | Admitting: Family Medicine

## 2022-08-21 VITALS — BP 126/78 | HR 67 | Temp 98.5°F | Ht 68.0 in | Wt 242.0 lb

## 2022-08-21 DIAGNOSIS — R053 Chronic cough: Secondary | ICD-10-CM

## 2022-08-21 DIAGNOSIS — Z0001 Encounter for general adult medical examination with abnormal findings: Secondary | ICD-10-CM | POA: Diagnosis not present

## 2022-08-21 DIAGNOSIS — Z0000 Encounter for general adult medical examination without abnormal findings: Secondary | ICD-10-CM

## 2022-08-21 DIAGNOSIS — Z23 Encounter for immunization: Secondary | ICD-10-CM | POA: Diagnosis not present

## 2022-08-21 LAB — COMPLETE METABOLIC PANEL WITH GFR
AG Ratio: 1.5 (calc) (ref 1.0–2.5)
ALT: 28 U/L (ref 9–46)
AST: 23 U/L (ref 10–35)
Albumin: 4.3 g/dL (ref 3.6–5.1)
Alkaline phosphatase (APISO): 45 U/L (ref 35–144)
BUN: 17 mg/dL (ref 7–25)
CO2: 26 mmol/L (ref 20–32)
Calcium: 9.4 mg/dL (ref 8.6–10.3)
Chloride: 105 mmol/L (ref 98–110)
Creat: 0.94 mg/dL (ref 0.70–1.30)
Globulin: 2.8 g/dL (calc) (ref 1.9–3.7)
Glucose, Bld: 119 mg/dL — ABNORMAL HIGH (ref 65–99)
Potassium: 4.3 mmol/L (ref 3.5–5.3)
Sodium: 138 mmol/L (ref 135–146)
Total Bilirubin: 1.1 mg/dL (ref 0.2–1.2)
Total Protein: 7.1 g/dL (ref 6.1–8.1)
eGFR: 95 mL/min/{1.73_m2} (ref >=60)

## 2022-08-21 LAB — CBC WITH DIFFERENTIAL/PLATELET
Absolute Monocytes: 469 cells/uL (ref 200–950)
Basophils Absolute: 41 cells/uL (ref 0–200)
Basophils Relative: 0.9 %
Eosinophils Absolute: 101 cells/uL (ref 15–500)
Eosinophils Relative: 2.2 %
HCT: 45.5 % (ref 38.5–50.0)
Hemoglobin: 15.2 g/dL (ref 13.2–17.1)
Lymphs Abs: 1964 cells/uL (ref 850–3900)
MCH: 31.5 pg (ref 27.0–33.0)
MCHC: 33.4 g/dL (ref 32.0–36.0)
MCV: 94.4 fL (ref 80.0–100.0)
MPV: 10.9 fL (ref 7.5–12.5)
Monocytes Relative: 10.2 %
Neutro Abs: 2024 cells/uL (ref 1500–7800)
Neutrophils Relative %: 44 %
Platelets: 173 10*3/uL (ref 140–400)
RBC: 4.82 10*6/uL (ref 4.20–5.80)
RDW: 11.8 % (ref 11.0–15.0)
Total Lymphocyte: 42.7 %
WBC: 4.6 10*3/uL (ref 3.8–10.8)

## 2022-08-21 LAB — LIPID PANEL
Cholesterol: 143 mg/dL (ref <200)
HDL: 51 mg/dL (ref >=40)
LDL Cholesterol (Calc): 77 mg/dL (calc)
Non-HDL Cholesterol (Calc): 92 mg/dL (calc) (ref <130)
Total CHOL/HDL Ratio: 2.8 (calc) (ref <5.0)
Triglycerides: 69 mg/dL (ref <150)

## 2022-08-21 LAB — TEST AUTHORIZATION

## 2022-08-21 LAB — PSA: PSA: 1.1 ng/mL (ref <=4.00)

## 2022-08-21 LAB — HEMOGLOBIN A1C W/OUT EAG: Hgb A1c MFr Bld: 5.9 % of total Hgb — ABNORMAL HIGH (ref <5.7)

## 2022-08-21 MED ORDER — LOSARTAN POTASSIUM 100 MG PO TABS: 100.0000 mg | ORAL_TABLET | Freq: Every day | ORAL | 3 refills | Status: DC

## 2022-08-21 NOTE — Progress Notes (Signed)
Subjective:    Patient ID: Paul Lynn, male    DOB: 11/06/1965, 57 y.o.   MRN: [id number]  HPI  Patient is here today for complete physical exam. He has no medical concerns.  Patient had a colonoscopy in 2018 that came back completely normal.  He is not due again until 2028.  Patient is due for the shingles vaccine.  He also reports an episodic cough.  Has been going on for quite some time.  He denies any fevers or chills or weight loss.  He denies any hemoptysis.  He is on lisinopril forHTN.  He denies GERD.  The remainder of his lab work is listed below Lab on 08/16/2022  Component Date Value Ref Range Status   WBC 08/16/2022 4.6  3.8 - 10.8 Thousand/uL Final   RBC 08/16/2022 4.82  4.20 - 5.80 Million/uL Final   Hemoglobin 08/16/2022 15.2  13.2 - 17.1 g/dL Final   HCT 11/91/4782 45.5  38.5 - 50.0 % Final   MCV 08/16/2022 94.4  80.0 - 100.0 fL Final   MCH 08/16/2022 31.5  27.0 - 33.0 pg Final   MCHC 08/16/2022 33.4  32.0 - 36.0 g/dL Final   RDW 95/62/1308 11.8  11.0 - 15.0 % Final   Platelets 08/16/2022 173  140 - 400 Thousand/uL Final   MPV 08/16/2022 10.9  7.5 - 12.5 fL Final   Neutro Abs 08/16/2022 2,024  1,500 - 7,800 cells/uL Final   Lymphs Abs 08/16/2022 1,964  850 - 3,900 cells/uL Final   Absolute Monocytes 08/16/2022 469  200 - 950 cells/uL Final   Eosinophils Absolute 08/16/2022 101  15 - 500 cells/uL Final   Basophils Absolute 08/16/2022 41  0 - 200 cells/uL Final   Neutrophils Relative % 08/16/2022 44  % Final   Total Lymphocyte 08/16/2022 42.7  % Final   Monocytes Relative 08/16/2022 10.2  % Final   Eosinophils Relative 08/16/2022 2.2  % Final   Basophils Relative 08/16/2022 0.9  % Final   Glucose, Bld 08/16/2022 119 (H)  65 - 99 mg/dL Final   Comment: .            Fasting reference interval . For someone without known diabetes, a glucose value between 100 and 125 mg/dL is consistent with prediabetes and should be confirmed with a follow-up test. .    BUN  08/16/2022 17  7 - 25 mg/dL Final   Creat 65/78/4696 0.94  0.70 - 1.30 mg/dL Final   eGFR 29/52/8413 95  > OR = 60 mL/min/1.37m2 Final   BUN/Creatinine Ratio 08/16/2022 SEE NOTE:  6 - 22 (calc) Final   Comment:    Not Reported: BUN and Creatinine are within    reference range. .    Sodium 08/16/2022 138  135 - 146 mmol/L Final   Potassium 08/16/2022 4.3  3.5 - 5.3 mmol/L Final   Chloride 08/16/2022 105  98 - 110 mmol/L Final   CO2 08/16/2022 26  20 - 32 mmol/L Final   Calcium 08/16/2022 9.4  8.6 - 10.3 mg/dL Final   Total Protein 24/40/1027 7.1  6.1 - 8.1 g/dL Final   Albumin 25/36/6440 4.3  3.6 - 5.1 g/dL Final   Globulin 34/74/2595 2.8  1.9 - 3.7 g/dL (calc) Final   AG Ratio 08/16/2022 1.5  1.0 - 2.5 (calc) Final   Total Bilirubin 08/16/2022 1.1  0.2 - 1.2 mg/dL Final   Alkaline phosphatase (APISO) 08/16/2022 45  35 - 144 U/L Final   AST  08/16/2022 23  10 - 35 U/L Final   ALT 08/16/2022 28  9 - 46 U/L Final   Cholesterol 08/16/2022 143  <200 mg/dL Final   HDL 82/95/6213 51  > OR = 40 mg/dL Final   Triglycerides 08/65/7846 69  <150 mg/dL Final   LDL Cholesterol (Calc) 08/16/2022 77  mg/dL (calc) Final   Comment: Reference range: <100 . Desirable range <100 mg/dL for primary prevention;   <70 mg/dL for patients with CHD or diabetic patients  with > or = 2 CHD risk factors. Marland Kitchen LDL-C is now calculated using the Martin-Hopkins  calculation, which is a validated novel method providing  better accuracy than the Friedewald equation in the  estimation of LDL-C.  Horald Pollen et al. Lenox Ahr. 9629;528(41): 2061-2068  (http://education.[facility].com/faq/FAQ164)    Total CHOL/HDL Ratio 08/16/2022 2.8  <3.2 (calc) Final   Non-HDL Cholesterol (Calc) 08/16/2022 92  <130 mg/dL (calc) Final   Comment: For patients with diabetes plus 1 major ASCVD risk  factor, treating to a non-HDL-C goal of <100 mg/dL  (LDL-C of <44 mg/dL) is considered a therapeutic  option.    PSA 08/16/2022 1.10  <  OR = 4.00 ng/mL Final   Comment: The total PSA value from this assay system is  standardized against the WHO standard. The test  result will be approximately 20% lower when compared  to the equimolar-standardized total PSA (Beckman  Coulter). Comparison of serial PSA results should be  interpreted with this fact in mind. . This test was performed using the Siemens  chemiluminescent method. Values obtained from  different assay methods cannot be used interchangeably. PSA levels, regardless of value, should not be interpreted as absolute evidence of the presence or absence of disease.    Hgb A1c MFr Bld 08/16/2022 5.9 (H)  <5.7 % of total Hgb Final   Comment: For someone without known diabetes, a hemoglobin  A1c value between 5.7% and 6.4% is consistent with prediabetes and should be confirmed with a  follow-up test. . For someone with known diabetes, a value <7% indicates that their diabetes is well controlled. A1c targets should be individualized based on duration of diabetes, age, comorbid conditions, and other considerations. . This assay result is consistent with an increased risk of diabetes. . Currently, no consensus exists regarding use of hemoglobin A1c for diagnosis of diabetes for children. . . This test was performed on the Roche cobas c503 platform. Effective 02/05/22, a change in test platforms from the Abbott Architect to the Roche cobas c503 may have shifted HbA1c results compared to historical results. Based on laboratory validation testing conducted at [facility], the Roche platform relative to the Abbott platform had an average increase in HbA1c value of < or = 0.3%. This difference is within accepted                            variability established by the [facility]. Note that not all individuals will have had a shift in their results and direct comparisons between historical and current results for testing conducted  on different platforms is not recommended.    TEST NAME: 08/16/2022 HEMOGLOBIN A1c   Final   TEST CODE: 08/16/2022 [id number]   Final   CLIENT CONTACT: 08/16/2022 Dwana Curd   Final   REPORT ALWAYS MESSAGE SIGNATURE 08/16/2022    Final   Comment: . The laboratory testing on this patient was verbally requested or confirmed by the ordering  physician or his or her authorized representative after contact with an employee of [employer]. Federal regulations require that we maintain on file written authorization for all laboratory testing.  Accordingly we are asking that the ordering physician or his or her authorized representative sign a copy of this report and promptly return it to the client service representative. . . Signature:____________________________________________________ . Please fax this signed page to [phone] or return it via your [employer] courier.   Office Visit on 07/30/2022  Component Date Value Ref Range Status   Source: 07/30/2022 NASAL   Final   INFLUENZA A ANTIGEN 07/30/2022 NOT DETECTED  NOT DETECTED Final   INFLUENZA B ANTIGEN 07/30/2022 NOT DETECTED  NOT DETECTED Final   Comment: The sensitivity of this direct antigen immunoassay for influenza A and B is poor when compared to molecular or culture methods. Therefore, a negative result does not exclude influenza virus infection. If clinically indicated, consider ordering Culture, Influenza A and B, Rapid Method or Influenza virus A/B RNA, QL Real Time RT PCR.    Past Medical History:  Diagnosis Date   Gout    Hypertension    Past Surgical History:  Procedure Laterality Date   CARPAL TUNNEL RELEASE Right 04/05/2021   Procedure: RIGHT CARPAL TUNNEL RELEASE;  Surgeon: Marlyne Beards, MD;  Location[facility];  Service: Orthopedics;  Laterality: Right;   ULNAR TUNNEL RELEASE Right 04/05/2021   Procedure: RIGHT CUBITAL TUNNEL RELEASE;  Surgeon: Marlyne Beards, MD;  Location: [facility];  Service:  Orthopedics;  Laterality: Right;   WISDOM TOOTH EXTRACTION     Current Outpatient Medications on File Prior to Visit  Medication Sig Dispense Refill   allopurinol (ZYLOPRIM) 100 MG tablet TAKE 3 TABLETS BY MOUTH DAILY 270 tablet 1   HYDROcodone bit-homatropine (HYCODAN) 5-1.5 MG/5ML syrup Take 5 mLs by mouth every 8 (eight) hours as needed for cough. 120 mL 0   indomethacin (INDOCIN) 50 MG capsule Take 1 capsule (50 mg total) by mouth 2 (two) times daily with a meal. 60 capsule 2   No current facility-administered medications on file prior to visit.   No Known Allergies Social History   Socioeconomic History   Marital status: Married    Spouse name: Not on file   Number of children: Not on file   Years of education: Not on file   Highest education level: Not on file  Occupational History   Not on file  Tobacco Use   Smoking status: Never   Smokeless tobacco: Never  Vaping Use   Vaping Use: Never used  Substance and Sexual Activity   Alcohol use: Yes    Comment: occasional   Drug use: No   Sexual activity: Yes  Other Topics Concern   Not on file  Social History Narrative   Not on file   Social Determinants of Health   Financial Resource Strain: Not on file  Food Insecurity: Not on file  Transportation Needs: Not on file  Physical Activity: Not on file  Stress: Not on file  Social Connections: Not on file  Intimate Partner Violence: Not on file   Family History  Problem Relation Age of Onset   Cancer Father        prostate   Prostate cancer Father    Cancer Paternal Uncle        prostate   Prostate cancer Paternal Uncle    Colon cancer Neg Hx    Colon polyps Neg Hx    Esophageal cancer Neg  Hx    Rectal cancer Neg Hx    Stomach cancer Neg Hx      Review of Systems  All other systems reviewed and are negative.      Objective:   Physical Exam Vitals reviewed.  Constitutional:      General: He is not in acute distress.    Appearance: He is  well-developed. He is not diaphoretic.  HENT:     Head: Normocephalic and atraumatic.     Right Ear: External ear normal.     Left Ear: External ear normal.     Nose: Nose normal.     Mouth/Throat:     Pharynx: No oropharyngeal exudate.  Eyes:     General: No scleral icterus.       Right eye: No discharge.        Left eye: No discharge.     Conjunctiva/sclera: Conjunctivae normal.     Pupils: Pupils are equal, round, and reactive to light.  Neck:     Thyroid: No thyromegaly.     Vascular: No JVD.     Trachea: No tracheal deviation.  Cardiovascular:     Rate and Rhythm: Normal rate and regular rhythm.     Heart sounds: Normal heart sounds. No murmur heard.    No friction rub. No gallop.  Pulmonary:     Effort: Pulmonary effort is normal. No respiratory distress.     Breath sounds: Normal breath sounds. No stridor. No wheezing or rales.  Chest:     Chest wall: No tenderness.  Abdominal:     General: Bowel sounds are normal. There is no distension.     Palpations: Abdomen is soft. There is no mass.     Tenderness: There is no abdominal tenderness. There is no guarding or rebound.  Musculoskeletal:        General: No tenderness. Normal range of motion.     Cervical back: Normal range of motion and neck supple.  Lymphadenopathy:     Cervical: No cervical adenopathy.  Skin:    General: Skin is warm.     Coloration: Skin is not pale.     Findings: No erythema or rash.  Neurological:     Mental Status: He is alert and oriented to person, place, and time.     Cranial Nerves: No cranial nerve deficit.     Motor: No abnormal muscle tone.     Coordination: Coordination normal.     Deep Tendon Reflexes: Reflexes normal.  Psychiatric:        Behavior: Behavior normal.        Thought Content: Thought content normal.        Judgment: Judgment normal.        Assessment & Plan:  Chronic cough - Plan: DG Chest 2 View  Need for zoster vaccination  General medical exam The  chronic cough could either be due to an ACE inhibitor or to GERD.  I recommended holding lisinopril and replacing with losartan 100 mg a day.  If not improving, get chest x-ray and consider a proton pump inhibitor.  Patient received his first dose of the shingles vaccine today.  Cholesterol is excellent.  His lab work is outstanding except for prediabetes.  I recommended 30 minutes a day 5 days a week aerobic exercise, low-carb diet, weight loss.  We also discussed the GLP-1 agonist.  Colonoscopy is up-to-date and PSA is normal

## 2022-08-24 ENCOUNTER — Ambulatory Visit
Admission: RE | Admit: 2022-08-24 | Discharge: 2022-08-24 | Disposition: A | Discharge status: Home / Self Care | Payer: BC Managed Care – PPO | Source: Ambulatory Visit | Attending: Family Medicine | Admitting: Family Medicine

## 2022-08-24 DIAGNOSIS — R053 Chronic cough: Secondary | ICD-10-CM | POA: Diagnosis not present

## 2022-08-27 ENCOUNTER — Encounter: Payer: Self-pay | Admitting: Family Medicine

## 2022-08-28 ENCOUNTER — Other Ambulatory Visit: Payer: Self-pay

## 2022-08-28 DIAGNOSIS — R7303 Prediabetes: Secondary | ICD-10-CM

## 2022-08-28 DIAGNOSIS — I10 Essential (primary) hypertension: Secondary | ICD-10-CM

## 2022-08-28 MED ORDER — OZEMPIC (0.25 OR 0.5 MG/DOSE) 2 MG/3ML ~~LOC~~ SOPN: 0.5000 mg | PEN_INJECTOR | SUBCUTANEOUS | 1 refills | Status: DC

## 2022-08-30 ENCOUNTER — Telehealth: Payer: Self-pay

## 2022-08-30 NOTE — Telephone Encounter (Signed)
PA for Ozempic sent:  Paul Lynn (Key: [id number])  Your information has been sent to [facility].

## 2022-09-27 NOTE — Telephone Encounter (Signed)
Received fax from [facility] and pt's Ozempic PA has been denied. Mjp,lpn

## 2022-10-03 DIAGNOSIS — R197 Diarrhea, unspecified: Secondary | ICD-10-CM | POA: Diagnosis not present

## 2022-11-17 DIAGNOSIS — M79672 Pain in left foot: Secondary | ICD-10-CM | POA: Diagnosis not present

## 2022-12-19 ENCOUNTER — Ambulatory Visit: Payer: BC Managed Care – PPO | Admitting: Family Medicine

## 2022-12-19 ENCOUNTER — Encounter: Payer: Self-pay | Admitting: Family Medicine

## 2022-12-19 VITALS — BP 130/76 | HR 97 | Temp 98.5°F | Ht 68.0 in | Wt 234.8 lb

## 2022-12-19 DIAGNOSIS — R197 Diarrhea, unspecified: Secondary | ICD-10-CM

## 2022-12-19 NOTE — Progress Notes (Signed)
Subjective:  HPI: Paul Lynn is a 57 y.o. male presenting on 12/19/2022 for Diarrhea (Pt c/o diarrhea x 2 days with bright red rectal bleeding with some clotting per pt. )   Diarrhea    Patient is in today for 2 days of diarrhea with bright red rectal bleeding with clots. He has been able to eat rice and bread. Denies nausea, vomiting, fever. Endorses mild abdominal cramping. No history of this. No known hemorrhoids. The bleeding has decreased. He normally has soft formed stools 3-4 times daily and is now having diarrhea every hour.  Review of Systems  Gastrointestinal:  Positive for diarrhea.  All other systems reviewed and are negative.   Relevant past medical history reviewed and updated as indicated.   Past Medical History:  Diagnosis Date   Gout    Hypertension      Past Surgical History:  Procedure Laterality Date   CARPAL TUNNEL RELEASE Right 04/05/2021   Procedure: RIGHT CARPAL TUNNEL RELEASE;  Surgeon: Marlyne Beards, MD;  Location: [facility];  Service: Orthopedics;  Laterality: Right;   ULNAR TUNNEL RELEASE Right 04/05/2021   Procedure: RIGHT CUBITAL TUNNEL RELEASE;  Surgeon: Marlyne Beards, MD;  Location: [facility] OR;  Service: Orthopedics;  Laterality: Right;   WISDOM TOOTH EXTRACTION      Allergies and medications reviewed and updated.   Current Outpatient Medications:    allopurinol (ZYLOPRIM) 100 MG tablet, TAKE 3 TABLETS BY MOUTH DAILY, Disp: 270 tablet, Rfl: 1   HYDROcodone bit-homatropine (HYCODAN) 5-1.5 MG/5ML syrup, Take 5 mLs by mouth every 8 (eight) hours as needed for cough., Disp: 120 mL, Rfl: 0   indomethacin (INDOCIN) 50 MG capsule, Take 1 capsule (50 mg total) by mouth 2 (two) times daily with a meal., Disp: 60 capsule, Rfl: 2   losartan (COZAAR) 100 MG tablet, Take 1 tablet (100 mg total) by mouth daily., Disp: 90 tablet, Rfl: 3   Semaglutide,0.25 or 0.5MG /DOS, (OZEMPIC, 0.25 OR 0.5 MG/DOSE,) 2 MG/3ML SOPN, Inject 0.5 mg into the skin once a  week., Disp: 3 mL, Rfl: 1  No Known Allergies  Objective:   BP 130/76   Pulse 97   Temp 98.5 F (36.9 C)   Ht 5\' 8"  (1.727 m)   Wt 234 lb 12.8 oz (106.5 kg)   SpO2 98%   BMI 35.70 kg/m      12/19/2022   11:13 AM 08/21/2022    9:20 AM 07/30/2022    9:32 AM  Vitals with BMI  Height 5\' 8"  5\' 8"  5\' 8"   Weight 234 lbs 13 oz 242 lbs 237 lbs  BMI 35.71 36.8 36.04  Systolic 130 126 161  Diastolic 76 78 86  Pulse 97 67 89     Physical Exam Vitals and nursing note reviewed.  Constitutional:      Appearance: Normal appearance. He is normal weight.  HENT:     Head: Normocephalic and atraumatic.  Cardiovascular:     Rate and Rhythm: Normal rate and regular rhythm.     Pulses: Normal pulses.     Heart sounds: Normal heart sounds.  Pulmonary:     Effort: Pulmonary effort is normal.     Breath sounds: Normal breath sounds.  Abdominal:     General: Bowel sounds are increased.     Palpations: Abdomen is soft.     Tenderness: There is abdominal tenderness in the left lower quadrant.  Skin:    General: Skin is warm and dry.     Capillary  Refill: Capillary refill takes less than 2 seconds.  Neurological:     General: No focal deficit present.     Mental Status: He is alert and oriented to person, place, and time. Mental status is at baseline.  Psychiatric:        Mood and Affect: Mood normal.        Behavior: Behavior normal.        Thought Content: Thought content normal.        Judgment: Judgment normal.     Assessment & Plan:  Bloody diarrhea Assessment & Plan: Will obtain CBC, CMP, and stool culture with occult blood and O&P. Referral placed to GI for colonoscopy. Encouraged to push fluids, eat a bland diet as tolerated, and drink electrolyte fluids. Seek medical care for pallor, lightheadedness, dizziness, acutely worsening abdominal pain, worsening blood in stool, or new or worsening symptoms.  Orders: -     CBC with Differential/Platelet -     Ova and parasite  examination -     Fecal Globin By Immunochemistry -     Gastrointestinal Pathogen Pnl RT, PCR -     Ambulatory referral to Gastroenterology -     COMPLETE METABOLIC PANEL WITH GFR     Follow up plan: Return if symptoms worsen or fail to improve.  Park Meo, FNP

## 2022-12-19 NOTE — Assessment & Plan Note (Signed)
Will obtain CBC, CMP, and stool culture with occult blood and O&P. Referral placed to GI for colonoscopy. Encouraged to push fluids, eat a bland diet as tolerated, and drink electrolyte fluids. Seek medical care for pallor, lightheadedness, dizziness, acutely worsening abdominal pain, worsening blood in stool, or new or worsening symptoms.

## 2022-12-20 LAB — CBC WITH DIFFERENTIAL/PLATELET
Absolute Monocytes: 621 {cells}/uL (ref 200–950)
Basophils Absolute: 28 {cells}/uL (ref 0–200)
Basophils Relative: 0.4 %
Eosinophils Absolute: 7 {cells}/uL — ABNORMAL LOW (ref 15–500)
Eosinophils Relative: 0.1 %
HCT: 45.6 % (ref 38.5–50.0)
Hemoglobin: 15.4 g/dL (ref 13.2–17.1)
Lymphs Abs: 1573 {cells}/uL (ref 850–3900)
MCH: 31.7 pg (ref 27.0–33.0)
MCHC: 33.8 g/dL (ref 32.0–36.0)
MCV: 93.8 fL (ref 80.0–100.0)
MPV: 11.1 fL (ref 7.5–12.5)
Monocytes Relative: 9 %
Neutro Abs: 4671 {cells}/uL (ref 1500–7800)
Neutrophils Relative %: 67.7 %
Platelets: 177 10*3/uL (ref 140–400)
RBC: 4.86 10*6/uL (ref 4.20–5.80)
RDW: 12.8 % (ref 11.0–15.0)
Total Lymphocyte: 22.8 %
WBC: 6.9 10*3/uL (ref 3.8–10.8)

## 2022-12-20 LAB — COMPLETE METABOLIC PANEL WITH GFR
AG Ratio: 1.6 (calc) (ref 1.0–2.5)
ALT: 34 U/L (ref 9–46)
AST: 26 U/L (ref 10–35)
Albumin: 4.5 g/dL (ref 3.6–5.1)
Alkaline phosphatase (APISO): 41 U/L (ref 35–144)
BUN: 19 mg/dL (ref 7–25)
CO2: 22 mmol/L (ref 20–32)
Calcium: 9.8 mg/dL (ref 8.6–10.3)
Chloride: 106 mmol/L (ref 98–110)
Creat: 0.97 mg/dL (ref 0.70–1.30)
Globulin: 2.8 g/dL (ref 1.9–3.7)
Glucose, Bld: 110 mg/dL — ABNORMAL HIGH (ref 65–99)
Potassium: 4.1 mmol/L (ref 3.5–5.3)
Sodium: 139 mmol/L (ref 135–146)
Total Bilirubin: 1.1 mg/dL (ref 0.2–1.2)
Total Protein: 7.3 g/dL (ref 6.1–8.1)
eGFR: 91 mL/min/{1.73_m2} (ref >=60)

## 2022-12-24 ENCOUNTER — Encounter: Payer: Self-pay | Admitting: Family Medicine

## 2022-12-26 ENCOUNTER — Encounter: Payer: Self-pay | Admitting: Gastroenterology

## 2022-12-26 ENCOUNTER — Ambulatory Visit: Payer: BC Managed Care – PPO | Admitting: Gastroenterology

## 2022-12-26 VITALS — BP 160/88 | HR 68 | Ht 68.0 in | Wt 239.0 lb

## 2022-12-26 DIAGNOSIS — R197 Diarrhea, unspecified: Secondary | ICD-10-CM | POA: Diagnosis not present

## 2022-12-26 DIAGNOSIS — K5730 Diverticulosis of large intestine without perforation or abscess without bleeding: Secondary | ICD-10-CM

## 2022-12-26 DIAGNOSIS — K625 Hemorrhage of anus and rectum: Secondary | ICD-10-CM

## 2022-12-26 MED ORDER — NA SULFATE-K SULFATE-MG SULF 17.5-3.13-1.6 GM/177ML PO SOLN: 1.0000 | Freq: Once | ORAL | 0 refills | Status: AC

## 2022-12-26 NOTE — Patient Instructions (Addendum)
If your blood pressure at your visit was 140/90 or greater, please contact your primary care physician to follow up on this. ______________________________________________________  If you are age 57 or older, your body mass index should be between 23-30. Your Body mass index is 36.34 kg/m. If this is out of the aforementioned range listed, please consider follow up with your Primary Care Provider.  If you are age 44 or younger, your body mass index should be between 19-25. Your Body mass index is 36.34 kg/m. If this is out of the aformentioned range listed, please consider follow up with your Primary Care Provider.  ________________________________________________________  The [facility] providers would like to encourage you to use [facility] to communicate with providers for non-urgent requests or questions.  Due to long hold times on the telephone, sending your provider a message by [facility] may be a faster and more efficient way to get a response.  Please allow 48 business hours for a response.  Please remember that this is for non-urgent requests.  _______________________________________________________  Due to recent changes in healthcare laws, you may see the results of your imaging and laboratory studies on MyChart before your provider has had a chance to review them.  We understand that in some cases there may be results that are confusing or concerning to you. Not all laboratory results come back in the same time frame and the provider may be waiting for multiple results in order to interpret others.  Please give [place] 48 hours in order for your provider to thoroughly review all the results before contacting the office for clarification of your results.   You have been scheduled for a colonoscopy. Please follow written instructions given to you at your visit today.   Please pick up your prep supplies at the pharmacy within the next 1-3 days.  If you use inhalers (even only as needed), please  bring them with you on the day of your procedure.  DO NOT TAKE 7 DAYS PRIOR TO TEST- Trulicity (dulaglutide) Ozempic, Wegovy (semaglutide) Mounjaro (tirzepatide) Bydureon Bcise (exanatide extended release)  DO NOT TAKE 1 DAY PRIOR TO YOUR TEST Rybelsus (semaglutide) Adlyxin (lixisenatide) Victoza (liraglutide) Byetta (exanatide) ___________________________________________________________________________  Thank you for entrusting me with your care and for choosing [facility], Dr. Ileene Patrick

## 2022-12-26 NOTE — Progress Notes (Signed)
HPI :  57 y/o male with a history of gout, hypertension, prediabetes, diverticulosis, referred back here by Dr. Lynnea Ferrier for evaluation of blood in the stools.  He previously was followed by Dr. Christella Hartigan, this is my first time meeting him.  He reports roughly 1 week ago, he woke up and had breakfast.  Shortly thereafter he developed acute diarrhea.  He states he had roughly 4 episodes of loose stools, after that he started passing just blood per rectum with clots.  Red blood, no melena.  He denies any rectal pain with this.  No abdominal pains with this at all.  He did have some cramping with the diarrhea.  He denies any fevers.  No new medications around this time.  He denies any sick contacts or eating in restaurants around this time.  He states he was eating normal foods.  He reported he had multiple episodes of passing blood per rectum and that stopped on its own.  Overall symptoms lasted less than 24 hours.  He has not had any recurrence since this episode.  He states his bowels have returned to regular.  He denies any blood in his stools at baseline before or after this.  Denies any hemorrhoidal symptoms.  Denies any routine use of NSAIDs, uses Indocin as needed for gout.  I see Ozempic and his medication list, he states he is not taking that it was ordered for possible prediabetes but not covered by his insurance so he never took it.  Denies any weight loss.  No family history of colon cancer.  His last colonoscopy was in 2018 with Dr. Christella Hartigan.  He had multiple left-sided diverticula with some associated inflammatory changes.  Otherwise the exam was normal without polyps.  We had a good discussion about whether or not he wanted to pursue colonoscopy for the symptoms or not.  He had labs checked by his primary care that day which showed no anemia, no leukocytosis.  Stool tests were ordered but he never submitted it as his diarrhea and bleeding had resolved.    Colonoscopy 12/10/2016 - Dr.  Christella Hartigan -  Multiple small and large-mouthed diverticula were found in the left colon. There was mucosal edema, petechia associated with the diverticulum. Findings: - The exam was otherwise without abnormality on direct and retroflexion views.    Past Medical History:  Diagnosis Date   Gout    Hypertension      Past Surgical History:  Procedure Laterality Date   CARPAL TUNNEL RELEASE Right 04/05/2021   Procedure: RIGHT CARPAL TUNNEL RELEASE;  Surgeon: Marlyne Beards, MD;  Location: [facility];  Service: Orthopedics;  Laterality: Right;   ULNAR TUNNEL RELEASE Right 04/05/2021   Procedure: RIGHT CUBITAL TUNNEL RELEASE;  Surgeon: Marlyne Beards, MD;  Location: [facility];  Service: Orthopedics;  Laterality: Right;   WISDOM TOOTH EXTRACTION     Family History  Problem Relation Age of Onset   Cancer Father        prostate   Prostate cancer Father    Cancer Paternal Uncle        prostate   Prostate cancer Paternal Uncle    Colon cancer Neg Hx    Esophageal cancer Neg Hx    Liver disease Neg Hx    Social History   Tobacco Use   Smoking status: Never   Smokeless tobacco: Never  Vaping Use   Vaping status: Never Used  Substance Use Topics   Alcohol use: Yes    Comment: occasional  Drug use: No   Current Outpatient Medications  Medication Sig Dispense Refill   allopurinol (ZYLOPRIM) 100 MG tablet TAKE 3 TABLETS BY MOUTH DAILY 270 tablet 1   HYDROcodone bit-homatropine (HYCODAN) 5-1.5 MG/5ML syrup Take 5 mLs by mouth every 8 (eight) hours as needed for cough. 120 mL 0   indomethacin (INDOCIN) 50 MG capsule Take 1 capsule (50 mg total) by mouth 2 (two) times daily with a meal. 60 capsule 2   losartan (COZAAR) 100 MG tablet Take 1 tablet (100 mg total) by mouth daily. 90 tablet 3   Semaglutide,0.25 or 0.5MG /DOS, (OZEMPIC, 0.25 OR 0.5 MG/DOSE,) 2 MG/3ML SOPN Inject 0.5 mg into the skin once a week. 3 mL 1   No current facility-administered medications for this visit.   No Known  Allergies   Review of Systems: All systems reviewed and negative except where noted in HPI.   Lab Results  Component Value Date   WBC 6.9 12/19/2022   HGB 15.4 12/19/2022   HCT 45.6 12/19/2022   MCV 93.8 12/19/2022   PLT 177 12/19/2022    Lab Results  Component Value Date   NA 139 12/19/2022   CL 106 12/19/2022   K 4.1 12/19/2022   CO2 22 12/19/2022   BUN 19 12/19/2022   CREATININE 0.97 12/19/2022   EGFR 91 12/19/2022   CALCIUM 9.8 12/19/2022   ALBUMIN 4.2 09/25/2016   GLUCOSE 110 (H) 12/19/2022    Lab Results  Component Value Date   ALT 34 12/19/2022   AST 26 12/19/2022   ALKPHOS 46 09/25/2016   BILITOT 1.1 12/19/2022     Physical Exam: BP (!) 160/88   Pulse 68   Ht 5\' 8"  (1.727 m)   Wt 239 lb (108.4 kg)   BMI 36.34 kg/m  Constitutional: Pleasant,well-developed, male in no acute distress. HEENT: Normocephalic and atraumatic. Conjunctivae are normal. No scleral icterus. Neck supple.  Cardiovascular: Normal rate, regular rhythm.  Pulmonary/chest: Effort normal and breath sounds normal.  Abdominal: Soft, nondistended, nontender. There are no masses palpable. No hepatomegaly. Extremities: no edema Lymphadenopathy: No cervical adenopathy noted. Neurological: Alert and oriented to person place and time. Skin: Skin is warm and dry. No rashes noted. Psychiatric: Normal mood and affect. Behavior is normal.   ASSESSMENT: 57 y.o. male here for assessment of the following  1. Rectal bleeding   2. Diarrhea, unspecified type   3. Diverticulosis of colon without hemorrhage    History as above.  Acute onset diarrhea with multiple loose stools, followed by the development of passing pure blood with clots, multiple episodes of that and then resolution < 24 hours.  Hgb remained normal. Stool testing was not completed given his resolution of symptoms. Now back to baseline bowel habits.   We discussed differential diagnosis.  Possible he had an infectious enteritis with  some bleeding, perhaps with some associated diverticular bleeding.  He did not have any abdominal pain with this, ischemic colitis is unlikely in this light.  He has no baseline symptoms regards to possible risks for IBD.  We discussed the likelihood of a polyp or mass that could be bleeding like this would be very unusual without any baseline symptoms.  I suspect benign/reactive bleeding, however his last colonoscopy was 6 years ago.  We discussed if he wanted to do another colonoscopy or not for these symptoms, understanding that if he does proceed with it, my suspicion for concerning pathology such as a mass or bleeding polyp will be low.  He would  otherwise not be due for another colonoscopy for 4 years for screening purposes.  We discussed colonoscopy, risks and benefits of the procedure and exam, ultimately after discussion of this for peace of mind he wanted to proceed with it to confirm nothing concerning causing his bleeding.  If the exam otherwise does not show any concerning pathology that he would be okay for another 10 years till his next exam.  In the interim if he has any recurrent symptoms he will contact us for reassessment.  PLAN: - schedule colonoscopy at the [facility] - call in the interim with any recurrent symptoms  Harlin Rain, MD [facility]  CC: [name], MD

## 2023-01-14 ENCOUNTER — Other Ambulatory Visit: Payer: Self-pay

## 2023-01-14 ENCOUNTER — Telehealth: Payer: Self-pay | Admitting: Family Medicine

## 2023-01-14 DIAGNOSIS — M1A9XX0 Chronic gout, unspecified, without tophus (tophi): Secondary | ICD-10-CM

## 2023-01-14 MED ORDER — ALLOPURINOL 100 MG PO TABS: 300.0000 mg | ORAL_TABLET | Freq: Every day | ORAL | 1 refills | Status: DC

## 2023-01-14 NOTE — Telephone Encounter (Signed)
Prescription Request  01/14/2023  LOV: 08/21/2022  What is the name of the medication or equipment?   allopurinol (ZYLOPRIM) 100 MG tablet SIG: take 3 tablets by mouth daily  Qty PRESCRIBED: 270.0  **90 day supply requested**  Have you contacted your pharmacy to request a refill? Yes   Which pharmacy would you like this sent to?  [id number] [place], [place] - [address] [place] [address] Phone: [phone] Fax: [phone]    Patient notified that their request is being sent to the clinical staff for review and that they should receive a response within 2 business days.   Please advise pharmacist.

## 2023-02-15 ENCOUNTER — Encounter: Payer: Self-pay | Admitting: Gastroenterology

## 2023-03-01 ENCOUNTER — Encounter: Payer: BC Managed Care – PPO | Admitting: Gastroenterology

## 2023-05-16 ENCOUNTER — Other Ambulatory Visit: Payer: Self-pay | Admitting: Family Medicine

## 2023-05-16 DIAGNOSIS — M1A9XX0 Chronic gout, unspecified, without tophus (tophi): Secondary | ICD-10-CM

## 2023-05-16 NOTE — Telephone Encounter (Signed)
Requested Prescriptions  Pending Prescriptions Disp Refills   allopurinol (ZYLOPRIM) 100 MG tablet [Pharmacy Med Name: ALLOPURINOL 100 MG TABLET] 270 tablet 1    Sig: TAKE 3 TABLETS BY MOUTH DAILY     Endocrinology:  Gout Agents - allopurinol Failed - 05/16/2023  8:50 AM      Failed - Uric Acid in normal range and within 360 days    Uric Acid, Serum  Date Value Ref Range Status  10/12/2013 6.5 4.0 - 7.8 mg/dL Final         Failed - Valid encounter within last 12 months    Recent Outpatient Visits           2 years ago Carpal tunnel syndrome of right wrist   [facility] Pickard, Priscille Heidelberg, MD   4 years ago General medical exam   [facility] [name], MD   5 years ago General medical exam   [facility] [name], MD   6 years ago General medical exam   [facility] [name], MD   8 years ago Routine general medical examination at a health care facility   [facility] Pickard, Priscille Heidelberg, MD       Future Appointments             In 3 months Pickard, Priscille Heidelberg, MD [facility]            Passed - Cr in normal range and within 360 days    Creat  Date Value Ref Range Status  12/19/2022 0.97 0.70 - 1.30 mg/dL Final         Passed - CBC within normal limits and completed in the last 12 months    WBC  Date Value Ref Range Status  12/19/2022 6.9 3.8 - 10.8 Thousand/uL Final   RBC  Date Value Ref Range Status  12/19/2022 4.86 4.20 - 5.80 Million/uL Final   Hemoglobin  Date Value Ref Range Status  12/19/2022 15.4 13.2 - 17.1 g/dL Final   HCT  Date Value Ref Range Status  12/19/2022 45.6 38.5 - 50.0 % Final   MCHC  Date Value Ref Range Status  12/19/2022 33.8 32.0 - 36.0 g/dL Final   [facility]  Date Value Ref Range Status  12/19/2022 31.7 27.0 - 33.0 pg Final   MCV  Date Value Ref Range Status  12/19/2022 93.8 80.0 - 100.0 fL  Final   No results found for: "PLTCOUNTKUC", "LABPLAT", "POCPLA" RDW  Date Value Ref Range Status  12/19/2022 12.8 11.0 - 15.0 % Final

## 2023-08-19 ENCOUNTER — Other Ambulatory Visit: Payer: BC Managed Care – PPO

## 2023-08-19 DIAGNOSIS — R7303 Prediabetes: Secondary | ICD-10-CM

## 2023-08-19 DIAGNOSIS — Z13220 Encounter for screening for lipoid disorders: Secondary | ICD-10-CM | POA: Diagnosis not present

## 2023-08-19 DIAGNOSIS — Z125 Encounter for screening for malignant neoplasm of prostate: Secondary | ICD-10-CM | POA: Diagnosis not present

## 2023-08-19 DIAGNOSIS — I10 Essential (primary) hypertension: Secondary | ICD-10-CM | POA: Diagnosis not present

## 2023-08-19 LAB — COMPLETE METABOLIC PANEL WITHOUT GFR
AG Ratio: 1.5 (calc) (ref 1.0–2.5)
ALT: 27 U/L (ref 9–46)
AST: 20 U/L (ref 10–35)
Albumin: 4.1 g/dL (ref 3.6–5.1)
Alkaline phosphatase (APISO): 55 U/L (ref 35–144)
BUN: 15 mg/dL (ref 7–25)
CO2: 26 mmol/L (ref 20–32)
Calcium: 9.4 mg/dL (ref 8.6–10.3)
Chloride: 104 mmol/L (ref 98–110)
Creat: 0.87 mg/dL (ref 0.70–1.30)
Globulin: 2.7 g/dL (ref 1.9–3.7)
Glucose, Bld: 110 mg/dL — ABNORMAL HIGH (ref 65–99)
Potassium: 4.4 mmol/L (ref 3.5–5.3)
Sodium: 138 mmol/L (ref 135–146)
Total Bilirubin: 0.8 mg/dL (ref 0.2–1.2)
Total Protein: 6.8 g/dL (ref 6.1–8.1)

## 2023-08-19 LAB — CBC WITH DIFFERENTIAL/PLATELET
Absolute Lymphocytes: 2531 {cells}/uL (ref 850–3900)
Absolute Monocytes: 790 {cells}/uL (ref 200–950)
Basophils Absolute: 38 {cells}/uL (ref 0–200)
Basophils Relative: 0.5 %
Eosinophils Absolute: 160 {cells}/uL (ref 15–500)
Eosinophils Relative: 2.1 %
HCT: 48 % (ref 38.5–50.0)
Hemoglobin: 15.9 g/dL (ref 13.2–17.1)
MCH: 31.4 pg (ref 27.0–33.0)
MCHC: 33.1 g/dL (ref 32.0–36.0)
MCV: 94.7 fL (ref 80.0–100.0)
MPV: 11.1 fL (ref 7.5–12.5)
Monocytes Relative: 10.4 %
Neutro Abs: 4081 {cells}/uL (ref 1500–7800)
Neutrophils Relative %: 53.7 %
Platelets: 163 10*3/uL (ref 140–400)
RBC: 5.07 10*6/uL (ref 4.20–5.80)
RDW: 12.2 % (ref 11.0–15.0)
Total Lymphocyte: 33.3 %
WBC: 7.6 10*3/uL (ref 3.8–10.8)

## 2023-08-19 LAB — PSA: PSA: 4.93 ng/mL — ABNORMAL HIGH (ref <=4.00)

## 2023-08-19 LAB — LIPID PANEL
Cholesterol: 119 mg/dL (ref <200)
HDL: 48 mg/dL (ref >=40)
LDL Cholesterol (Calc): 46 mg/dL
Non-HDL Cholesterol (Calc): 71 mg/dL (ref <130)
Total CHOL/HDL Ratio: 2.5 (calc) (ref <5.0)
Triglycerides: 172 mg/dL — ABNORMAL HIGH (ref <150)

## 2023-08-20 ENCOUNTER — Other Ambulatory Visit: Payer: Self-pay | Admitting: Family Medicine

## 2023-08-20 NOTE — Telephone Encounter (Signed)
 Requested Prescriptions  Pending Prescriptions Disp Refills   losartan  (COZAAR ) 100 MG tablet [Pharmacy Med Name: LOSARTAN  POTASSIUM 100 MG TAB] 90 tablet 0    Sig: TAKE 1 TABLET BY MOUTH EVERY DAY     Cardiovascular:  Angiotensin Receptor Blockers Failed - 08/20/2023  4:22 PM      Failed - Last BP in normal range    BP Readings from Last 1 Encounters:  12/26/22 (!) 160/88         Failed - Valid encounter within last 6 months    Recent Outpatient Visits           8 months ago Bloody diarrhea   [facility] Jenelle Mis, FNP   12 months ago Chronic cough   [facility] Cheril Cork, Cisco Crest, MD   1 year ago Viral URI with cough   [facility] Jenelle Mis, FNP       Future Appointments             In 3 days Austine Lefort, MD [facility]            Passed - Cr in normal range and within 180 days    Creat  Date Value Ref Range Status  08/19/2023 0.87 0.70 - 1.30 mg/dL Final         Passed - K in normal range and within 180 days    Potassium  Date Value Ref Range Status  08/19/2023 4.4 3.5 - 5.3 mmol/L Final         Passed - Patient is not pregnant

## 2023-08-23 ENCOUNTER — Encounter: Payer: Self-pay | Admitting: Family Medicine

## 2023-08-23 ENCOUNTER — Ambulatory Visit: Payer: BC Managed Care – PPO | Admitting: Family Medicine

## 2023-08-23 VITALS — BP 130/82 | HR 72 | Temp 98.2°F | Ht 68.0 in | Wt 243.0 lb

## 2023-08-23 DIAGNOSIS — Z0000 Encounter for general adult medical examination without abnormal findings: Secondary | ICD-10-CM

## 2023-08-23 DIAGNOSIS — Z0001 Encounter for general adult medical examination with abnormal findings: Secondary | ICD-10-CM | POA: Diagnosis not present

## 2023-08-23 DIAGNOSIS — R972 Elevated prostate specific antigen [PSA]: Secondary | ICD-10-CM | POA: Diagnosis not present

## 2023-08-23 DIAGNOSIS — R7303 Prediabetes: Secondary | ICD-10-CM

## 2023-08-23 DIAGNOSIS — I10 Essential (primary) hypertension: Secondary | ICD-10-CM | POA: Diagnosis not present

## 2023-08-23 MED ORDER — SILDENAFIL CITRATE 100 MG PO TABS: 100.0000 mg | ORAL_TABLET | Freq: Every day | ORAL | 11 refills | Status: AC | PRN

## 2023-08-23 NOTE — Progress Notes (Signed)
 Subjective:    Patient ID: Paul Lynn, male    DOB: Apr 09, 1966, 58 y.o.   MRN: [id number]  HPI  Patient is here today for complete physical exam. He has no medical concerns.  Patient had a colonoscopy in 2018 that came back completely normal.  He is not due again until 2028.  The remainder of his lab work is listed below.  Labs are significant for an elevated PSA.  Patient has a family history of prostate cancer in his father. Lab on 08/19/2023  Component Date Value Ref Range Status   WBC 08/19/2023 7.6  3.8 - 10.8 Thousand/uL Final   RBC 08/19/2023 5.07  4.20 - 5.80 Million/uL Final   Hemoglobin 08/19/2023 15.9  13.2 - 17.1 g/dL Final   HCT 24/40/1027 48.0  38.5 - 50.0 % Final   MCV 08/19/2023 94.7  80.0 - 100.0 fL Final   MCH 08/19/2023 31.4  27.0 - 33.0 pg Final   MCHC 08/19/2023 33.1  32.0 - 36.0 g/dL Final   Comment: For adults, a slight decrease in the calculated MCHC value (in the range of 30 to 32 g/dL) is most likely not clinically significant; however, it should be interpreted with caution in correlation with other red cell parameters and the patient's clinical condition.    RDW 08/19/2023 12.2  11.0 - 15.0 % Final   Platelets 08/19/2023 163  140 - 400 Thousand/uL Final   MPV 08/19/2023 11.1  7.5 - 12.5 fL Final   Neutro Abs 08/19/2023 4,081  1,500 - 7,800 cells/uL Final   Absolute Lymphocytes 08/19/2023 2,531  850 - 3,900 cells/uL Final   Absolute Monocytes 08/19/2023 790  200 - 950 cells/uL Final   Eosinophils Absolute 08/19/2023 160  15 - 500 cells/uL Final   Basophils Absolute 08/19/2023 38  0 - 200 cells/uL Final   Neutrophils Relative % 08/19/2023 53.7  % Final   Total Lymphocyte 08/19/2023 33.3  % Final   Monocytes Relative 08/19/2023 10.4  % Final   Eosinophils Relative 08/19/2023 2.1  % Final   Basophils Relative 08/19/2023 0.5  % Final   Glucose, Bld 08/19/2023 110 (H)  65 - 99 mg/dL Final   Comment: .            Fasting reference interval . For  someone without known diabetes, a glucose value between 100 and 125 mg/dL is consistent with prediabetes and should be confirmed with a follow-up test. .    BUN 08/19/2023 15  7 - 25 mg/dL Final   Creat 25/36/6440 0.87  0.70 - 1.30 mg/dL Final   BUN/Creatinine Ratio 08/19/2023 SEE NOTE:  6 - 22 (calc) Final   Comment:    Not Reported: BUN and Creatinine are within    reference range. .    Sodium 08/19/2023 138  135 - 146 mmol/L Final   Potassium 08/19/2023 4.4  3.5 - 5.3 mmol/L Final   Chloride 08/19/2023 104  98 - 110 mmol/L Final   CO2 08/19/2023 26  20 - 32 mmol/L Final   Calcium 08/19/2023 9.4  8.6 - 10.3 mg/dL Final   Total Protein 34/74/2595 6.8  6.1 - 8.1 g/dL Final   Albumin 63/87/5643 4.1  3.6 - 5.1 g/dL Final   Globulin 32/95/1884 2.7  1.9 - 3.7 g/dL (calc) Final   AG Ratio 08/19/2023 1.5  1.0 - 2.5 (calc) Final   Total Bilirubin 08/19/2023 0.8  0.2 - 1.2 mg/dL Final   Alkaline phosphatase (APISO) 08/19/2023 55  35 -  144 U/L Final   AST 08/19/2023 20  10 - 35 U/L Final   ALT 08/19/2023 27  9 - 46 U/L Final   Cholesterol 08/19/2023 119  <200 mg/dL Final   HDL 60/45/4098 48  > OR = 40 mg/dL Final   Triglycerides 11/91/4782 172 (H)  <150 mg/dL Final   LDL Cholesterol (Calc) 08/19/2023 46  mg/dL (calc) Final   Comment: Reference range: <100 . Desirable range <100 mg/dL for primary prevention;   <70 mg/dL for patients with CHD or diabetic patients  with > or = 2 CHD risk factors. Aaron Aas LDL-C is now calculated using the Martin-Hopkins  calculation, which is a validated novel method providing  better accuracy than the Friedewald equation in the  estimation of LDL-C.  Melinda Sprawls et al. Erroll Heard. 9562;130(86): 2061-2068  (http://education.[employer].com/faq/FAQ164)    Total CHOL/HDL Ratio 08/19/2023 2.5  <5.7 (calc) Final   Non-HDL Cholesterol (Calc) 08/19/2023 71  <130 mg/dL (calc) Final   Comment: For patients with diabetes plus 1 major ASCVD risk  factor, treating to a  non-HDL-C goal of <100 mg/dL  (LDL-C of <84 mg/dL) is considered a therapeutic  option.    PSA 08/19/2023 4.93 (H)  < OR = 4.00 ng/mL Final   Comment: The total PSA value from this assay system is  standardized against the WHO standard. The test  result will be approximately 20% lower when compared  to the equimolar-standardized total PSA (Beckman  Coulter). Comparison of serial PSA results should be  interpreted with this fact in mind. . This test was performed using the Siemens  chemiluminescent method. Values obtained from  different assay methods cannot be used interchangeably. PSA levels, regardless of value, should not be interpreted as absolute evidence of the presence or absence of disease.    Past Medical History:  Diagnosis Date   Gout    Hypertension    Past Surgical History:  Procedure Laterality Date   CARPAL TUNNEL RELEASE Right 04/05/2021   Procedure: RIGHT CARPAL TUNNEL RELEASE;  Surgeon: Marilyn Shropshire, MD;  Location: [facility] OR;  Service: Orthopedics;  Laterality: Right;   ULNAR TUNNEL RELEASE Right 04/05/2021   Procedure: RIGHT CUBITAL TUNNEL RELEASE;  Surgeon: Marilyn Shropshire, MD;  Location: [facility] OR;  Service: Orthopedics;  Laterality: Right;   WISDOM TOOTH EXTRACTION     Current Outpatient Medications on File Prior to Visit  Medication Sig Dispense Refill   allopurinol  (ZYLOPRIM ) 100 MG tablet TAKE 3 TABLETS BY MOUTH DAILY 270 tablet 1   HYDROcodone  bit-homatropine (HYCODAN) 5-1.5 MG/5ML syrup Take 5 mLs by mouth every 8 (eight) hours as needed for cough. 120 mL 0   indomethacin  (INDOCIN ) 50 MG capsule Take 1 capsule (50 mg total) by mouth 2 (two) times daily with a meal. 60 capsule 2   losartan  (COZAAR ) 100 MG tablet TAKE 1 TABLET BY MOUTH EVERY DAY 90 tablet 0   No current facility-administered medications on file prior to visit.   No Known Allergies Social History   Socioeconomic History   Marital status: Married    Spouse name: Not on file   Number  of children: 2   Years of education: Not on file   Highest education level: Not on file  Occupational History   Occupation: welder  Tobacco Use   Smoking status: Never   Smokeless tobacco: Never  Vaping Use   Vaping status: Never Used  Substance and Sexual Activity   Alcohol use: Yes    Comment: occasional   Drug use:  No   Sexual activity: Yes  Other Topics Concern   Not on file  Social History Narrative   Not on file   Social Drivers of Health   Financial Resource Strain: Not on file  Food Insecurity: Not on file  Transportation Needs: Not on file  Physical Activity: Not on file  Stress: Not on file  Social Connections: Not on file  Intimate Partner Violence: Not on file   Family History  Problem Relation Age of Onset   Cancer Father        prostate   Prostate cancer Father    Cancer Paternal Uncle        prostate   Prostate cancer Paternal Uncle    Colon cancer Neg Hx    Esophageal cancer Neg Hx    Liver disease Neg Hx      Review of Systems  All other systems reviewed and are negative.      Objective:   Physical Exam Vitals reviewed.  Constitutional:      General: He is not in acute distress.    Appearance: He is well-developed. He is obese. He is not ill-appearing or diaphoretic.  HENT:     Head: Normocephalic and atraumatic.     Right Ear: Tympanic membrane, ear canal and external ear normal.     Left Ear: Tympanic membrane, ear canal and external ear normal.     Nose: Nose normal. No congestion or rhinorrhea.     Mouth/Throat:     Mouth: Mucous membranes are moist.     Pharynx: Oropharynx is clear. No oropharyngeal exudate or posterior oropharyngeal erythema.  Eyes:     General: No scleral icterus.       Right eye: No discharge.        Left eye: No discharge.     Conjunctiva/sclera: Conjunctivae normal.     Pupils: Pupils are equal, round, and reactive to light.  Neck:     Thyroid: No thyromegaly.     Vascular: No carotid bruit or JVD.      Trachea: No tracheal deviation.  Cardiovascular:     Rate and Rhythm: Normal rate and regular rhythm.     Heart sounds: Normal heart sounds. No murmur heard.    No friction rub. No gallop.  Pulmonary:     Effort: Pulmonary effort is normal. No respiratory distress.     Breath sounds: Normal breath sounds. No stridor. No wheezing, rhonchi or rales.  Chest:     Chest wall: No tenderness.  Abdominal:     General: Bowel sounds are normal. There is no distension.     Palpations: Abdomen is soft. There is no mass.     Tenderness: There is no abdominal tenderness. There is no guarding or rebound.  Genitourinary:    Penis: Normal.      Prostate: Normal.     Rectum: Normal.  Musculoskeletal:        General: No tenderness. Normal range of motion.     Cervical back: Normal range of motion and neck supple. No rigidity or tenderness.  Lymphadenopathy:     Cervical: No cervical adenopathy.  Skin:    General: Skin is warm.     Coloration: Skin is not jaundiced or pale.     Findings: No bruising, erythema, lesion or rash.  Neurological:     General: No focal deficit present.     Mental Status: He is alert and oriented to person, place, and time.     Cranial  Nerves: No cranial nerve deficit.     Sensory: No sensory deficit.     Motor: No weakness or abnormal muscle tone.     Coordination: Coordination normal.     Gait: Gait normal.     Deep Tendon Reflexes: Reflexes normal.  Psychiatric:        Behavior: Behavior normal.        Thought Content: Thought content normal.        Judgment: Judgment normal.        Assessment & Plan:  General medical exam  Hypertension, unspecified type  Elevated PSA - Plan: Ambulatory referral to Urology  Prediabetes  Morbid obesity (HCC) Prediabetes is stable.  Cholesterol is outstanding.  BP is excellent.  DRE is normal but due to family history will consult urology for elevated psa.  Colonoscopy is utd.  Recommended 2nd shingles vaccine.

## 2023-10-03 DIAGNOSIS — N400 Enlarged prostate without lower urinary tract symptoms: Secondary | ICD-10-CM | POA: Diagnosis not present

## 2023-10-03 DIAGNOSIS — R972 Elevated prostate specific antigen [PSA]: Secondary | ICD-10-CM | POA: Diagnosis not present

## 2023-11-15 DIAGNOSIS — M654 Radial styloid tenosynovitis [de Quervain]: Secondary | ICD-10-CM | POA: Diagnosis not present

## 2023-11-20 ENCOUNTER — Other Ambulatory Visit: Payer: Self-pay | Admitting: Family Medicine

## 2023-11-20 DIAGNOSIS — M1A9XX0 Chronic gout, unspecified, without tophus (tophi): Secondary | ICD-10-CM

## 2023-11-20 NOTE — Telephone Encounter (Signed)
 Prescription Request  11/20/2023  LOV: 08/23/2023  What is the name of the medication or equipment?   allopurinol  (ZYLOPRIM ) 100 MG tablet   losartan  (COZAAR ) 100 MG tablet [[id number]]  **90 day scripts requested for both meds**  Have you contacted your pharmacy to request a refill? Yes   Which pharmacy would you like this sent to?  [id number] [place], [place] - [address] [place] [address] Phone: [phone] Fax: [phone]    Patient notified that their request is being sent to the clinical staff for review and that they should receive a response within 2 business days.   Please advise pharmacist.

## 2023-11-21 MED ORDER — LOSARTAN POTASSIUM 100 MG PO TABS: 100.0000 mg | ORAL_TABLET | Freq: Every day | ORAL | 0 refills | Status: DC

## 2023-11-21 NOTE — Telephone Encounter (Signed)
 Requested medications are due for refill today.  yes  Requested medications are on the active medications list.  yes  Last refill. 05/16/2023 #270 1 rf  Future visit scheduled.   no  Notes to clinic.  Labs are expired.    Requested Prescriptions  Pending Prescriptions Disp Refills   allopurinol  (ZYLOPRIM ) 100 MG tablet 270 tablet 1    Sig: Take 3 tablets (300 mg total) by mouth daily.     Endocrinology:  Gout Agents - allopurinol  Failed - 11/21/2023  5:56 PM      Failed - Uric Acid in normal range and within 360 days    Uric Acid, Serum  Date Value Ref Range Status  10/12/2013 6.5 4.0 - 7.8 mg/dL Final         Passed - Cr in normal range and within 360 days    Creat  Date Value Ref Range Status  08/19/2023 0.87 0.70 - 1.30 mg/dL Final         Passed - Valid encounter within last 12 months    Recent Outpatient Visits           3 months ago General medical exam   [place] [facility] Duanne Butler DASEN, MD   11 months ago Bloody diarrhea   [place] [facility] Kayla Jeoffrey RAMAN, FNP   1 year ago Chronic cough   [place] [facility] Duanne Butler DASEN, MD   1 year ago Viral URI with cough   [place] [facility] Kayla Jeoffrey RAMAN, FNP              Passed - CBC within normal limits and completed in the last 12 months    WBC  Date Value Ref Range Status  08/19/2023 7.6 3.8 - 10.8 Thousand/uL Final   RBC  Date Value Ref Range Status  08/19/2023 5.07 4.20 - 5.80 Million/uL Final   Hemoglobin  Date Value Ref Range Status  08/19/2023 15.9 13.2 - 17.1 g/dL Final   HCT  Date Value Ref Range Status  08/19/2023 48.0 38.5 - 50.0 % Final   MCHC  Date Value Ref Range Status  08/19/2023 33.1 32.0 - 36.0 g/dL Final    Comment:    For adults, a slight decrease in the calculated MCHC value (in the range of 30 to 32 g/dL) is most likely not clinically significant; however, it should  be interpreted with caution in correlation with other red cell parameters and the patient's clinical condition.    [facility]  Date Value Ref Range Status  08/19/2023 31.4 27.0 - 33.0 pg Final   MCV  Date Value Ref Range Status  08/19/2023 94.7 80.0 - 100.0 fL Final   No results found for: PLTCOUNTKUC, LABPLAT, POCPLA RDW  Date Value Ref Range Status  08/19/2023 12.2 11.0 - 15.0 % Final         Signed Prescriptions Disp Refills   losartan  (COZAAR ) 100 MG tablet 90 tablet 0    Sig: Take 1 tablet (100 mg total) by mouth daily.     Cardiovascular:  Angiotensin Receptor Blockers Passed - 11/21/2023  5:56 PM      Passed - Cr in normal range and within 180 days    Creat  Date Value Ref Range Status  08/19/2023 0.87 0.70 - 1.30 mg/dL Final         Passed - K in normal range and within 180 days    Potassium  Date Value Ref Range Status  08/19/2023 4.4 3.5 - 5.3 mmol/L Final         Passed - Patient is not pregnant      Passed - Last BP in normal range    BP Readings from Last 1 Encounters:  08/23/23 130/82         Passed - Valid encounter within last 6 months    Recent Outpatient Visits           3 months ago General medical exam   [place] [facility] Duanne Butler DASEN, MD   11 months ago Bloody diarrhea   [place] [facility] Kayla Jeoffrey RAMAN, FNP   1 year ago Chronic cough   [place] [facility] Duanne, Butler DASEN, MD   1 year ago Viral URI with cough   [place] [facility] Kayla Jeoffrey RAMAN, FNP

## 2023-11-21 NOTE — Telephone Encounter (Signed)
 Requested Prescriptions  Pending Prescriptions Disp Refills   allopurinol  (ZYLOPRIM ) 100 MG tablet 270 tablet 1    Sig: Take 3 tablets (300 mg total) by mouth daily.     Endocrinology:  Gout Agents - allopurinol  Failed - 11/21/2023  5:56 PM      Failed - Uric Acid in normal range and within 360 days    Uric Acid, Serum  Date Value Ref Range Status  10/12/2013 6.5 4.0 - 7.8 mg/dL Final         Passed - Cr in normal range and within 360 days    Creat  Date Value Ref Range Status  08/19/2023 0.87 0.70 - 1.30 mg/dL Final         Passed - Valid encounter within last 12 months    Recent Outpatient Visits           3 months ago General medical exam   [place] [facility] Duanne Butler DASEN, MD   11 months ago Bloody diarrhea   [place] [facility] Kayla Jeoffrey RAMAN, FNP   1 year ago Chronic cough   [place] [facility] Duanne Butler DASEN, MD   1 year ago Viral URI with cough   [place] [facility] Kayla Jeoffrey RAMAN, FNP              Passed - CBC within normal limits and completed in the last 12 months    WBC  Date Value Ref Range Status  08/19/2023 7.6 3.8 - 10.8 Thousand/uL Final   RBC  Date Value Ref Range Status  08/19/2023 5.07 4.20 - 5.80 Million/uL Final   Hemoglobin  Date Value Ref Range Status  08/19/2023 15.9 13.2 - 17.1 g/dL Final   HCT  Date Value Ref Range Status  08/19/2023 48.0 38.5 - 50.0 % Final   MCHC  Date Value Ref Range Status  08/19/2023 33.1 32.0 - 36.0 g/dL Final    Comment:    For adults, a slight decrease in the calculated MCHC value (in the range of 30 to 32 g/dL) is most likely not clinically significant; however, it should be interpreted with caution in correlation with other red cell parameters and the patient's clinical condition.    [facility]  Date Value Ref Range Status  08/19/2023 31.4 27.0 - 33.0 pg Final   MCV  Date Value Ref Range Status  08/19/2023  94.7 80.0 - 100.0 fL Final   No results found for: PLTCOUNTKUC, LABPLAT, POCPLA RDW  Date Value Ref Range Status  08/19/2023 12.2 11.0 - 15.0 % Final          losartan  (COZAAR ) 100 MG tablet 90 tablet 0    Sig: Take 1 tablet (100 mg total) by mouth daily.     Cardiovascular:  Angiotensin Receptor Blockers Passed - 11/21/2023  5:56 PM      Passed - Cr in normal range and within 180 days    Creat  Date Value Ref Range Status  08/19/2023 0.87 0.70 - 1.30 mg/dL Final         Passed - K in normal range and within 180 days    Potassium  Date Value Ref Range Status  08/19/2023 4.4 3.5 - 5.3 mmol/L Final         Passed - Patient is not pregnant      Passed - Last BP in normal range    BP Readings from Last 1 Encounters:  08/23/23  130/82         Passed - Valid encounter within last 6 months    Recent Outpatient Visits           3 months ago General medical exam   [place] [facility] Duanne Butler DASEN, MD   11 months ago Bloody diarrhea   [place] [facility] Kayla Jeoffrey RAMAN, FNP   1 year ago Chronic cough   [place] [facility] Duanne, Butler DASEN, MD   1 year ago Viral URI with cough   [place] [facility] Kayla Jeoffrey RAMAN, FNP

## 2023-11-22 MED ORDER — ALLOPURINOL 100 MG PO TABS: 300.0000 mg | ORAL_TABLET | Freq: Every day | ORAL | 1 refills | Status: DC

## 2024-02-16 ENCOUNTER — Other Ambulatory Visit: Payer: Self-pay | Admitting: Family Medicine

## 2024-05-15 ENCOUNTER — Other Ambulatory Visit: Payer: Self-pay | Admitting: Family Medicine

## 2024-05-15 DIAGNOSIS — M1A9XX0 Chronic gout, unspecified, without tophus (tophi): Secondary | ICD-10-CM

## 2024-05-19 ENCOUNTER — Other Ambulatory Visit: Payer: Self-pay | Admitting: Family Medicine

## 2024-05-19 NOTE — Telephone Encounter (Signed)
 Prescription Request  05/19/2024  LOV: 08/23/2023  What is the name of the medication or equipment? losartan  (COZAAR ) 100 MG tablet [[id number]]   Have you contacted your pharmacy to request a refill? Yes   Which pharmacy would you like this sent to?  [id number] [address], [place] - [address] [place] [address] Phone: [phone] Fax: [phone]    Patient notified that their request is being sent to the clinical staff for review and that they should receive a response within 2 business days.   Please advise at [facility] [phone]

## 2024-05-19 NOTE — Telephone Encounter (Signed)
 Requested medications are due for refill today.  yes  Requested medications are on the active medications list.  yes  Last refill. 02/17/2024 #90 0 rf  Future visit scheduled.   no  Notes to clinic.  Expired labs    Requested Prescriptions  Pending Prescriptions Disp Refills   losartan  (COZAAR ) 100 MG tablet 90 tablet 0    Sig: Take 1 tablet (100 mg total) by mouth daily.     Cardiovascular:  Angiotensin Receptor Blockers Failed - 05/19/2024  4:10 PM      Failed - Cr in normal range and within 180 days    Creat  Date Value Ref Range Status  08/19/2023 0.87 0.70 - 1.30 mg/dL Final         Failed - K in normal range and within 180 days    Potassium  Date Value Ref Range Status  08/19/2023 4.4 3.5 - 5.3 mmol/L Final         Failed - Valid encounter within last 6 months    Recent Outpatient Visits           9 months ago General medical exam   [place] Duanne Butler DASEN, MD   1 year ago Bloody diarrhea   [place] [facility] Kayla Jeoffrey RAMAN, FNP   1 year ago Chronic cough   [place] [facility] Duanne, Butler DASEN, MD   1 year ago Viral URI with cough   [place] [facility] Kayla Jeoffrey RAMAN, [place]              Passed - Patient is not pregnant      Passed - Last BP in normal range    BP Readings from Last 1 Encounters:  08/23/23 130/82

## 2024-05-20 MED ORDER — LOSARTAN POTASSIUM 100 MG PO TABS: 100.0000 mg | ORAL_TABLET | Freq: Every day | ORAL | 0 refills | Status: AC
